# Patient Record
Sex: Female | Born: 1955 | Race: Black or African American | Hispanic: No | Marital: Single | Smoking: Never smoker
Health system: Southern US, Community
[De-identification: ages and names within clinical notes are randomized; demographics above are authoritative.]

## PROBLEM LIST (undated history)

## (undated) DIAGNOSIS — R748 Abnormal levels of other serum enzymes: Secondary | ICD-10-CM

## (undated) DIAGNOSIS — E876 Hypokalemia: Secondary | ICD-10-CM

## (undated) DIAGNOSIS — R9389 Abnormal findings on diagnostic imaging of other specified body structures: Secondary | ICD-10-CM

## (undated) DIAGNOSIS — I5030 Unspecified diastolic (congestive) heart failure: Secondary | ICD-10-CM

## (undated) DIAGNOSIS — I4892 Unspecified atrial flutter: Secondary | ICD-10-CM

## (undated) DIAGNOSIS — I480 Paroxysmal atrial fibrillation: Secondary | ICD-10-CM

## (undated) DIAGNOSIS — R3129 Other microscopic hematuria: Secondary | ICD-10-CM

## (undated) DIAGNOSIS — K219 Gastro-esophageal reflux disease without esophagitis: Secondary | ICD-10-CM

## (undated) HISTORY — PX: ABDOMINAL HYSTERECTOMY: SHX81

---

## 2013-09-03 ENCOUNTER — Inpatient Hospital Stay (HOSPITAL_BASED_OUTPATIENT_CLINIC_OR_DEPARTMENT_OTHER)
Admission: EM | Admit: 2013-09-03 | Discharge: 2013-09-04 | DRG: 287 | Disposition: A | Discharge status: Home / Self Care | Payer: BC Managed Care – PPO | Attending: Interventional Cardiology | Admitting: Interventional Cardiology

## 2013-09-03 ENCOUNTER — Emergency Department (HOSPITAL_BASED_OUTPATIENT_CLINIC_OR_DEPARTMENT_OTHER): Payer: BC Managed Care – PPO

## 2013-09-03 ENCOUNTER — Encounter (HOSPITAL_BASED_OUTPATIENT_CLINIC_OR_DEPARTMENT_OTHER): Payer: Self-pay | Admitting: Emergency Medicine

## 2013-09-03 ENCOUNTER — Inpatient Hospital Stay (HOSPITAL_COMMUNITY): Payer: BC Managed Care – PPO

## 2013-09-03 ENCOUNTER — Ambulatory Visit (HOSPITAL_COMMUNITY): Admit: 2013-09-03 | Payer: Self-pay | Admitting: Interventional Cardiology

## 2013-09-03 ENCOUNTER — Encounter (HOSPITAL_COMMUNITY)
Admission: EM | Disposition: A | Discharge status: Home / Self Care | Payer: BC Managed Care – PPO | Source: Home / Self Care | Attending: Interventional Cardiology

## 2013-09-03 DIAGNOSIS — R599 Enlarged lymph nodes, unspecified: Secondary | ICD-10-CM | POA: Diagnosis present

## 2013-09-03 DIAGNOSIS — R748 Abnormal levels of other serum enzymes: Secondary | ICD-10-CM | POA: Diagnosis present

## 2013-09-03 DIAGNOSIS — R9389 Abnormal findings on diagnostic imaging of other specified body structures: Secondary | ICD-10-CM | POA: Diagnosis present

## 2013-09-03 DIAGNOSIS — Z79899 Other long term (current) drug therapy: Secondary | ICD-10-CM

## 2013-09-03 DIAGNOSIS — I480 Paroxysmal atrial fibrillation: Secondary | ICD-10-CM | POA: Diagnosis present

## 2013-09-03 DIAGNOSIS — R0602 Shortness of breath: Secondary | ICD-10-CM | POA: Diagnosis present

## 2013-09-03 DIAGNOSIS — I5031 Acute diastolic (congestive) heart failure: Secondary | ICD-10-CM

## 2013-09-03 DIAGNOSIS — Z7982 Long term (current) use of aspirin: Secondary | ICD-10-CM

## 2013-09-03 DIAGNOSIS — I213 ST elevation (STEMI) myocardial infarction of unspecified site: Secondary | ICD-10-CM

## 2013-09-03 DIAGNOSIS — I4891 Unspecified atrial fibrillation: Secondary | ICD-10-CM

## 2013-09-03 DIAGNOSIS — R634 Abnormal weight loss: Secondary | ICD-10-CM

## 2013-09-03 DIAGNOSIS — R079 Chest pain, unspecified: Secondary | ICD-10-CM | POA: Diagnosis present

## 2013-09-03 DIAGNOSIS — R3129 Other microscopic hematuria: Secondary | ICD-10-CM | POA: Diagnosis present

## 2013-09-03 DIAGNOSIS — E876 Hypokalemia: Secondary | ICD-10-CM | POA: Diagnosis present

## 2013-09-03 DIAGNOSIS — I509 Heart failure, unspecified: Secondary | ICD-10-CM | POA: Diagnosis present

## 2013-09-03 DIAGNOSIS — R9431 Abnormal electrocardiogram [ECG] [EKG]: Secondary | ICD-10-CM | POA: Insufficient documentation

## 2013-09-03 DIAGNOSIS — I4892 Unspecified atrial flutter: Secondary | ICD-10-CM | POA: Diagnosis present

## 2013-09-03 HISTORY — DX: Gastro-esophageal reflux disease without esophagitis: K21.9

## 2013-09-03 HISTORY — PX: LEFT HEART CATHETERIZATION WITH CORONARY ANGIOGRAM: SHX5451

## 2013-09-03 HISTORY — DX: Abnormal findings on diagnostic imaging of other specified body structures: R93.89

## 2013-09-03 HISTORY — DX: Unspecified atrial flutter: I48.92

## 2013-09-03 HISTORY — DX: Other microscopic hematuria: R31.29

## 2013-09-03 HISTORY — DX: Paroxysmal atrial fibrillation: I48.0

## 2013-09-03 HISTORY — DX: Hypokalemia: E87.6

## 2013-09-03 HISTORY — DX: Unspecified diastolic (congestive) heart failure: I50.30

## 2013-09-03 HISTORY — DX: Abnormal levels of other serum enzymes: R74.8

## 2013-09-03 LAB — CBC WITH DIFFERENTIAL/PLATELET
Basophils Absolute: 0 10*3/uL (ref 0.0–0.1)
Basophils Relative: 0 % (ref 0–1)
Hemoglobin: 12.6 g/dL (ref 12.0–15.0)
MCH: 27.6 pg (ref 26.0–34.0)
MCHC: 33.6 g/dL (ref 30.0–36.0)
Neutro Abs: 3.8 10*3/uL (ref 1.7–7.7)
Neutrophils Relative %: 64 % (ref 43–77)
Platelets: 246 10*3/uL (ref 150–400)
RDW: 13.2 % (ref 11.5–15.5)

## 2013-09-03 LAB — COMPREHENSIVE METABOLIC PANEL
AST: 25 U/L (ref 0–37)
Albumin: 3.5 g/dL (ref 3.5–5.2)
Alkaline Phosphatase: 162 U/L — ABNORMAL HIGH (ref 39–117)
Calcium: 10.2 mg/dL (ref 8.4–10.5)
Chloride: 104 mEq/L (ref 96–112)
GFR calc Af Amer: >90 mL/min (ref >90)
Potassium: 3.6 mEq/L (ref 3.5–5.1)
Sodium: 141 mEq/L (ref 135–145)
Total Bilirubin: 0.8 mg/dL (ref 0.3–1.2)

## 2013-09-03 LAB — D-DIMER, QUANTITATIVE (NOT AT ARMC): D-Dimer, Quant: 0.47 ug/mL-FEU (ref 0.00–0.48)

## 2013-09-03 LAB — TROPONIN I: Troponin I: <0.3 ng/mL (ref <0.30)

## 2013-09-03 SURGERY — LEFT HEART CATHETERIZATION WITH CORONARY ANGIOGRAM
Anesthesia: LOCAL

## 2013-09-03 MED ORDER — NITROGLYCERIN IN D5W 200-5 MCG/ML-% IV SOLN
INTRAVENOUS | Status: AC
  Filled 2013-09-03: qty 250

## 2013-09-03 MED ORDER — NITROGLYCERIN 0.4 MG SL SUBL
0.4000 mg | SUBLINGUAL_TABLET | SUBLINGUAL | Status: DC | PRN
  Administered 2013-09-03: 0.4 mg via SUBLINGUAL
  Filled 2013-09-03 (×2): qty 25

## 2013-09-03 MED ORDER — ONDANSETRON HCL 4 MG/2ML IJ SOLN: 4.0000 mg | Freq: Four times a day (QID) | INTRAMUSCULAR | Status: DC | PRN

## 2013-09-03 MED ORDER — HEPARIN SODIUM (PORCINE) 5000 UNIT/ML IJ SOLN: 60.0000 [IU]/kg | INTRAMUSCULAR | Status: DC

## 2013-09-03 MED ORDER — ACETAMINOPHEN 325 MG PO TABS: 650.0000 mg | ORAL_TABLET | ORAL | Status: DC | PRN

## 2013-09-03 MED ORDER — METOPROLOL TARTRATE 25 MG PO TABS
25.0000 mg | ORAL_TABLET | Freq: Two times a day (BID) | ORAL | Status: DC
  Administered 2013-09-03 – 2013-09-04 (×2): 25 mg via ORAL
  Filled 2013-09-03 (×3): qty 1

## 2013-09-03 MED ORDER — HEPARIN SODIUM (PORCINE) 5000 UNIT/ML IJ SOLN
INTRAMUSCULAR | Status: AC
  Filled 2013-09-03: qty 1

## 2013-09-03 MED ORDER — SODIUM CHLORIDE 0.9 % IV SOLN: INTRAVENOUS | Status: DC

## 2013-09-03 MED ORDER — HEPARIN (PORCINE) IN NACL 100-0.45 UNIT/ML-% IJ SOLN
INTRAMUSCULAR | Status: AC
  Filled 2013-09-03: qty 250

## 2013-09-03 MED ORDER — IOHEXOL 350 MG/ML SOLN: 100.0000 mL | Freq: Once | INTRAVENOUS | Status: AC | PRN

## 2013-09-03 MED ORDER — METOPROLOL TARTRATE 25 MG PO TABS: 25.0000 mg | ORAL_TABLET | Freq: Two times a day (BID) | ORAL | Status: DC

## 2013-09-03 MED ORDER — HEPARIN BOLUS VIA INFUSION
4000.0000 [IU] | Freq: Once | INTRAVENOUS | Status: AC
  Administered 2013-09-03: 4000 [IU] via INTRAVENOUS

## 2013-09-03 MED ORDER — ASPIRIN 81 MG PO CHEW
324.0000 mg | CHEWABLE_TABLET | Freq: Once | ORAL | Status: AC
  Administered 2013-09-03: 162 mg via ORAL
  Filled 2013-09-03: qty 4

## 2013-09-03 MED ORDER — SODIUM CHLORIDE 0.9 % IV SOLN
1.0000 mL/kg/h | INTRAVENOUS | Status: AC
  Administered 2013-09-03: 1 mL/kg/h via INTRAVENOUS

## 2013-09-03 NOTE — Progress Notes (Signed)
*  Preliminary Results* Bilateral lower extremity venous duplex completed. Bilateral lower extremities are negative for deep vein thrombosis. There is no evidence of Baker's cyst bilaterally.  09/03/2013  Gertie Fey, RVT, RDCS, RDMS

## 2013-09-03 NOTE — H&P (Signed)
Admit date: 09/03/2013 Referring Physician Dr. Anitra Lauth Primary Cardiologist Eldridge Dace- new Chief complaint/reason for admission: chest pain, abnormal ECG  HPI: 57 y/o who has had some weight loss over the past few months.  She has had left leg swelling over the past few days.  It is worse when she stands for prolonged periods of time.  Swelling is always worse in teh left leg compared to the right leg.    Today, she felt some chest pain.  She has had some SHOB.  It is hard for her to get a deep breath.  She went to [facility].  Her ECG was quite abnormal with deep anterior T wave inversions.  Due to active CP, she was transferred to the [facility] Cath lab.      PMH:    Past Medical History  Diagnosis Date  . Chest pain, unspecified   . Shortness of breath   . PAF (paroxysmal atrial fibrillation)   . Nonspecific abnormal electrocardiogram (ECG) (EKG)     PSH:    Past Surgical History  Procedure Laterality Date  . Abdominal hysterectomy      ALLERGIES:   Review of patient's allergies indicates no known allergies.  Prior to Admit Meds:   No prescriptions prior to admission   Family HX:   No family history on file. Social HX:    History   Social History  . Marital Status: Single    Spouse Name: N/A    Number of Children: N/A  . Years of Education: N/A   Occupational History  . Not on file.   Social History Main Topics  . Smoking status: Never Smoker   . Smokeless tobacco: Not on file  . Alcohol Use: No  . Drug Use: No  . Sexual Activity: Not on file   Other Topics Concern  . Not on file   Social History Narrative  . No narrative on file     ROS:  All 11 ROS were addressed and are negative except what is stated in the HPI  PHYSICAL EXAM Filed Vitals:   09/03/13 1300  BP: 124/81  Pulse: 107  Temp: 98 F (36.7 C)  Resp: 16   General: Well developed, well nourished, in no acute distress Head: Eyes PERRLA, No xanthomas.   Normal cephalic and  atramatic  Lungs:  Clear bilaterally to auscultation and percussion. Heart:   HRRR S1 S2             No JVD.  No abdominal bruits. No femoral bruits. Abdomen: Bowel sounds are positive, abdomen soft and non-tender without masses or                  Hernia's noted. Msk:  Back normal, normal gait. Normal strength and tone for age. Extremities:   Pitting left leg edema. 2+ right radial pulse Neuro: Alert and oriented X 3. Psych:  Normal affect, responds appropriately   Labs:   Lab Results  Component Value Date   WBC 5.9 09/03/2013   HGB 12.6 09/03/2013   HCT 37.5 09/03/2013   MCV 82.1 09/03/2013   PLT 246 09/03/2013    Recent Labs Lab 09/03/13 1300  NA 141  K 3.6  CL 104  CO2 28  BUN 15  CREATININE 0.70  CALCIUM 10.2  PROT 7.6  BILITOT 0.8  ALKPHOS 162*  ALT 27  AST 25  GLUCOSE 105*   Lab Results  Component Value Date   TROPONINI <0.30 09/03/2013   No results found  for this basename: PTT   Lab Results  Component Value Date   INR 1.07 09/03/2013     No results found for this basename: CHOL   No results found for this basename: HDL   No results found for this basename: LDLCALC   No results found for this basename: TRIG   No results found for this basename: CHOLHDL   No results found for this basename: LDLDIRECT      Radiology:  @RISRSLT24 @  EKG:  Normal sinus rhythm, slight ST elevation with deep T wave inversion in the anterior leads  ASSESSMENT: Chest discomfort, shortness of breath, left leg swelling, abnormal ECG  PLAN:  She will undergo emergent cardiac catheterization. If no etiology for her chest discomfort is found, would have to look into possible PE as cause of her symptoms. She does have several worrisome features including left leg swelling along with chest discomfort or shortness of breath.  Further plans based on the cardiac cath results.  Corky Crafts., MD  09/03/2013  3:03 PM  Addendum: No significant coronary artery disease. The  patient has had intermittent atrial fibrillation and intermittent atrial flutter which has resolved spontaneously back to normal sinus rhythm. We'll plan for CT scan of the chest to evaluate for pulmonary embolism. Check d-dimer.

## 2013-09-03 NOTE — Progress Notes (Signed)
Utilization Review Completed.Alonna Bartling T1/21/2014   

## 2013-09-03 NOTE — ED Notes (Signed)
Pt reports onset of centralized burning chest pain since Saturday. Reports shortness of breath with exertion. Swelling in left ankle. Pain non radiating. No cardiac history.

## 2013-09-03 NOTE — ED Notes (Addendum)
[facility]ia [facility] for on call cardio doctor-- spoke with Maisie Fus

## 2013-09-03 NOTE — CV Procedure (Signed)
       PROCEDURE:  Left heart catheterization with selective coronary angiography, left ventriculogram.    INDICATIONS:  Suspected anterior STEMI  The risks, benefits, and details of the procedure were explained to the patient.  The patient verbalized understanding and wanted to proceed.  Informed written consent was obtained.  PROCEDURE TECHNIQUE:  After Xylocaine anesthesia a 45F slender sheath was placed in the right radial artery with a single anterior needle wall stick.   Right coronary angiography was done using a Judkins R4 guide catheter.  Left coronary angiography was done using a CLS 3.0 guide catheter.  Left ventriculography was done using a pigtail catheter.  A TR band was used for hemostasis.   CONTRAST:  Total of 80 cc.  COMPLICATIONS:  None.    HEMODYNAMICS:  Aortic pressure was 91/49; LV pressure was 93/2; LVEDP 14.  There was no gradient between the left ventricle and aorta.    ANGIOGRAPHIC DATA:   The left main coronary artery is angiographically normal.  The left anterior descending artery is a large vessel which wraps around the apex. There several small diagonal vessels which are widely patent. The LAD system appears angiographically normal.  The left circumflex artery is a large vessel. There is a large, branching first obtuse marginal. The circumflex system appears angiographically normal.  The right coronary artery is a medium size vessel which is patent. There is a small PDA and posterior lateral artery.  LEFT VENTRICULOGRAM:  Left ventricular angiogram was done in the 30 RAO projection and revealed normal left ventricular wall motion and systolic function with an estimated ejection fraction of 50-55 %.  LVEDP was 14 mmHg.  IMPRESSIONS:  1. Normal left main coronary artery. 2. Normal left anterior descending artery and its branches. 3. Normal left circumflex artery and its branches. 4. Normal right coronary artery. 5. Normal left ventricular systolic  function.  LVEDP 14 mmHg.  Ejection fraction 55 %. 6.  Atrial arrhythmias including atrial fibrillation and atrial flutter intermittently after the catheterization. I am concerned that this could also be from a PE.  RECOMMENDATION:  No significant coronary artery disease. Given her left leg swelling, chest discomfort and shortness of breath, we will check a CT angiogram to rule out pulmonary embolism. She has received 2 boluses of IV heparin.  We'll also check d-dimer.  May need to start Cardizem drip due to elevated heart rate.

## 2013-09-03 NOTE — ED Provider Notes (Addendum)
CSN: [id number]     Arrival date & time 09/03/13  1252 History   First MD Initiated Contact with Patient 09/03/13 1310     Chief Complaint  Patient presents with  . Chest Pain  . Shortness of Breath   (Consider location/radiation/quality/duration/timing/severity/associated sxs/prior Treatment) Patient is a 57 y.o. female presenting with chest pain and shortness of breath. The history is provided by the patient.  Chest Pain Pain location:  Substernal area Pain quality: burning, pressure and tightness   Pain radiates to:  Mid back Pain radiates to the back: yes   Pain severity:  Moderate Onset quality:  Gradual Duration:  20 minutes Timing:  Intermittent Progression:  Worsening Chronicity:  New Context comment:  Stared 3 days ago  Relieved by:  Rest and aspirin Worsened by:  Exertion (states feels very fatigued, SOB and CP with any exertion) Ineffective treatments:  None tried Associated symptoms: back pain, diaphoresis, nausea, shortness of breath and weakness   Associated symptoms: no abdominal pain, no fever, no near-syncope and not vomiting   Risk factors: no coronary artery disease, no diabetes mellitus, no high cholesterol, no hypertension and no smoking   Risk factors comment:  But has not seen a PCP in years Shortness of Breath Associated symptoms: chest pain and diaphoresis   Associated symptoms: no abdominal pain, no fever and no vomiting     History reviewed. No pertinent past medical history. Past Surgical History  Procedure Laterality Date  . Abdominal hysterectomy     No family history on file. History  Substance Use Topics  . Smoking status: Never Smoker   . Smokeless tobacco: Not on file  . Alcohol Use: No   OB History   Grav Para Term Preterm Abortions TAB SAB Ect Mult Living                 Review of Systems  Constitutional: Positive for diaphoresis. Negative for fever.  Respiratory: Positive for shortness of breath.   Cardiovascular: Positive for  chest pain and leg swelling. Negative for near-syncope.       Has noted swelling in her left ankle for months now but not painful.  Gastrointestinal: Positive for nausea. Negative for vomiting and abdominal pain.  Musculoskeletal: Positive for back pain.  Neurological: Positive for weakness.  All other systems reviewed and are negative.    Allergies  Review of patient's allergies indicates no known allergies.  Home Medications  No current outpatient prescriptions on file. BP 124/81  Pulse 107  Temp(Src) 98 F (36.7 C) (Oral)  Resp 16  SpO2 98% Physical Exam  Nursing note and vitals reviewed. Constitutional: She is oriented to person, place, and time. She appears well-developed and well-nourished. No distress.  HENT:  Head: Normocephalic and atraumatic.  Mouth/Throat: Oropharynx is clear and moist.  Eyes: Conjunctivae and EOM are normal. Pupils are equal, round, and reactive to light.  Neck: Normal range of motion. Neck supple.  Cardiovascular: Regular rhythm and intact distal pulses.  Tachycardia present.   No murmur heard. Pulmonary/Chest: Effort normal and breath sounds normal. No respiratory distress. She has no wheezes. She has no rales.  Abdominal: Soft. She exhibits no distension. There is no tenderness. There is no rebound and no guarding.  Musculoskeletal: Normal range of motion. She exhibits edema. She exhibits no tenderness.  1+ edema in the left ankle  Neurological: She is alert and oriented to person, place, and time.  Skin: Skin is warm and dry. No rash noted. No erythema.  Psychiatric: She has a normal mood and affect. Her behavior is normal.    ED Course  Procedures (including critical care time) Labs Review Labs Reviewed  CBC WITH DIFFERENTIAL  COMPREHENSIVE METABOLIC PANEL  PROTIME-INR  APTT  URINALYSIS, ROUTINE W REFLEX MICROSCOPIC  TROPONIN I   Imaging Review No results found.  EKG Interpretation    Date/Time:  Monday September 03 2013 13:03:28  EST Ventricular Rate:  103 PR Interval:  150 QRS Duration: 76 QT Interval:  402 QTC Calculation: 526 R Axis:   54 Text Interpretation:  Sinus tachycardia Minimal voltage criteria for LVH, may be normal variant T wave abnormality, consider anterior ischemia Prolonged QT No significant change since last tracing Confirmed by Anitra Lauth  MD, Tavonna Worthington ([id number]) on 09/03/2013 1:14:10 PM            MDM   1. STEMI (ST elevation myocardial infarction)     Pt came in with good story for ACS with EKG concerning with borderline ST elevation in anterior leads.  Second EKG with minimal change and pt currently with some chest tightness.  Discussed with Dr. Susa Simmonds with cardiology and they advised to call a code STEMI which was activated and [facility] transported [facility].  Mildly tachy here at 105 but deneis any medical problems of tobacco use.  Pt took 2 81mg  asa this am.    CRITICAL CARE Performed by: Gwyneth Sprout Total critical care time: 30 Critical care time was exclusive of separately billable procedures and treating other patients. Critical care was necessary to treat or prevent imminent or life-threatening deterioration. Critical care was time spent personally by me on the following activities: development of treatment plan with patient and/or surrogate as well as nursing, discussions with consultants, evaluation of patient's response to treatment, examination of patient, obtaining history from patient or surrogate, ordering and performing treatments and interventions, ordering and review of laboratory studies, ordering and review of radiographic studies, pulse oximetry and re-evaluation of patient's condition.    Gwyneth Sprout, MD 09/03/13 1329  Gwyneth Sprout, MD 09/03/13 1358

## 2013-09-04 ENCOUNTER — Other Ambulatory Visit: Payer: Self-pay | Admitting: Physician Assistant

## 2013-09-04 ENCOUNTER — Encounter (HOSPITAL_COMMUNITY): Payer: Self-pay | Admitting: Physician Assistant

## 2013-09-04 DIAGNOSIS — R748 Abnormal levels of other serum enzymes: Secondary | ICD-10-CM | POA: Diagnosis present

## 2013-09-04 DIAGNOSIS — R9389 Abnormal findings on diagnostic imaging of other specified body structures: Secondary | ICD-10-CM | POA: Diagnosis present

## 2013-09-04 DIAGNOSIS — I4892 Unspecified atrial flutter: Secondary | ICD-10-CM

## 2013-09-04 DIAGNOSIS — I509 Heart failure, unspecified: Secondary | ICD-10-CM

## 2013-09-04 DIAGNOSIS — R634 Abnormal weight loss: Secondary | ICD-10-CM

## 2013-09-04 DIAGNOSIS — E876 Hypokalemia: Secondary | ICD-10-CM | POA: Diagnosis present

## 2013-09-04 DIAGNOSIS — R3129 Other microscopic hematuria: Secondary | ICD-10-CM | POA: Diagnosis present

## 2013-09-04 DIAGNOSIS — I4891 Unspecified atrial fibrillation: Secondary | ICD-10-CM

## 2013-09-04 DIAGNOSIS — I5031 Acute diastolic (congestive) heart failure: Principal | ICD-10-CM

## 2013-09-04 DIAGNOSIS — I5030 Unspecified diastolic (congestive) heart failure: Secondary | ICD-10-CM

## 2013-09-04 LAB — TROPONIN I: Troponin I: 0.32 ng/mL (ref <0.30)

## 2013-09-04 LAB — URINALYSIS, ROUTINE W REFLEX MICROSCOPIC
Glucose, UA: NEGATIVE mg/dL
Specific Gravity, Urine: 1.01 (ref 1.005–1.030)
Urobilinogen, UA: 2 mg/dL — ABNORMAL HIGH (ref 0.0–1.0)

## 2013-09-04 LAB — BASIC METABOLIC PANEL
CO2: 26 mEq/L (ref 19–32)
Calcium: 9.5 mg/dL (ref 8.4–10.5)
Chloride: 105 mEq/L (ref 96–112)
Creatinine, Ser: 0.37 mg/dL — ABNORMAL LOW (ref 0.50–1.10)
Glucose, Bld: 90 mg/dL (ref 70–99)

## 2013-09-04 LAB — TSH: TSH: <0.008 u[IU]/mL — ABNORMAL LOW (ref 0.350–4.500)

## 2013-09-04 LAB — URINE MICROSCOPIC-ADD ON

## 2013-09-04 LAB — CK TOTAL AND CKMB (NOT AT ARMC)
CK, MB: 4 ng/mL (ref 0.3–4.0)
Total CK: 58 U/L (ref 7–177)

## 2013-09-04 LAB — T4, FREE: Free T4: 3.35 ng/dL — ABNORMAL HIGH (ref 0.80–1.80)

## 2013-09-04 MED ORDER — FUROSEMIDE 20 MG PO TABS
20.0000 mg | ORAL_TABLET | Freq: Every day | ORAL | Status: DC
  Filled 2013-09-04: qty 1

## 2013-09-04 MED ORDER — FUROSEMIDE 20 MG PO TABS: 20.0000 mg | ORAL_TABLET | Freq: Every day | ORAL | Status: DC

## 2013-09-04 MED ORDER — NAPROXEN SODIUM 220 MG PO TABS: 220.0000 mg | ORAL_TABLET | Freq: Every day | ORAL | Status: DC | PRN

## 2013-09-04 MED ORDER — METOPROLOL TARTRATE 25 MG PO TABS: 25.0000 mg | ORAL_TABLET | Freq: Two times a day (BID) | ORAL | Status: DC

## 2013-09-04 MED ORDER — POTASSIUM CHLORIDE CRYS ER 20 MEQ PO TBCR
20.0000 meq | EXTENDED_RELEASE_TABLET | Freq: Every day | ORAL | Status: DC
  Administered 2013-09-04: 20 meq via ORAL
  Filled 2013-09-04: qty 1

## 2013-09-04 MED ORDER — ASPIRIN 325 MG PO TBEC: 325.0000 mg | DELAYED_RELEASE_TABLET | Freq: Every day | ORAL | Status: DC

## 2013-09-04 MED ORDER — POTASSIUM CHLORIDE CRYS ER 20 MEQ PO TBCR
20.0000 meq | EXTENDED_RELEASE_TABLET | Freq: Once | ORAL | Status: AC
  Administered 2013-09-04: 20 meq via ORAL
  Filled 2013-09-04: qty 1

## 2013-09-04 MED ORDER — POTASSIUM CHLORIDE CRYS ER 20 MEQ PO TBCR: 20.0000 meq | EXTENDED_RELEASE_TABLET | Freq: Every day | ORAL | Status: DC

## 2013-09-04 MED FILL — Fentanyl Citrate Inj 0.05 MG/ML: INTRAMUSCULAR | Qty: 2 | Status: AC

## 2013-09-04 MED FILL — Midazolam HCl Inj 2 MG/2ML (Base Equivalent): INTRAMUSCULAR | Qty: 2 | Status: AC

## 2013-09-04 MED FILL — Lidocaine HCl Local Preservative Free (PF) Inj 1%: INTRAMUSCULAR | Qty: 30 | Status: AC

## 2013-09-04 MED FILL — Verapamil HCl IV Soln 2.5 MG/ML: INTRAVENOUS | Qty: 2 | Status: AC

## 2013-09-04 MED FILL — Nitroglycerin IV Soln 200 MCG/ML in D5W: INTRAVENOUS | Qty: 1 | Status: AC

## 2013-09-04 MED FILL — Heparin Sodium (Porcine) 2 Unit/ML in Sodium Chloride 0.9%: INTRAMUSCULAR | Qty: 1000 | Status: AC

## 2013-09-04 NOTE — Progress Notes (Addendum)
Patient with abnormal ECG and chest pain.  Cath showed no CAD.  CT was negative for PE and dissection.  No DVT by u/s.  It did show LVH.  Will start low dose diuretic and potassium.  F/u with me in 2 weeks with echocardiogram as well.  LVH may be the cause of abnormal ECG.  Patient walked without problems.  She really wants to go home.  May return to work on 09/10/13.  Full discharge to follow.

## 2013-09-04 NOTE — Discharge Summary (Signed)
Discharge Summary   Patient ID: Deborah Banks MRN: [id number], DOB/AGE: Jul 14, 1956 57 y.o. Admit date: 09/03/2013 D/C date:     09/04/2013  Primary Care Provider: No primary provider on file. - does not have one, but will be calling [facility] Primary Cardiologist: Eldridge Dace  Primary Discharge Diagnoses:  1. Acute diastolic CHF 2. Paroxysmal atrial fibrillation/flutter 3. Abnormal ECG with normal coronaries by cath, CT angio neg for PE/dissection 4. Abnormal CT scan with mild mediastinal lymphadenopathy - recommended to f/u PCP 5. Hypokalemia 6. Microscopic hematuria by UA 7. Elevated alkaline phosphatase level 8. Unintentional weight loss  Hospital Course:  Ms. Tarman is a 57 y/o F with limited PMH who presented initially to [facility] 09/03/2013 with CP, L>R leg swelling and SOB. She has had unintentional weight loss over the past few months. She reported left leg swelling over the past few days, worse when with standing for prolonged periods of time. It always seemed to be worse in the L compared to the R. On day of admission, she felt some CP and SOB. It was hard for her to get a deep breath. Because of sx, she went to [facility]. Her ECG was quite abnormal with deep anterior T wave inversions. Due to active CP, she was transferred to the [facility] Cath lab for emergent catheterization. This demonstrated normal coronary arteries, LV EF was 50-55%, LVEDP . She did have atrial arrhythmias including triansient atrial fibrillation and atrial flutter intermittently after the catheterization. CT angio was performed which ruled out PE and dissection. It did show fibrotic change in both upper lobes, areas of mild adenopathy in the anterior mediastinum, and prominent heart with LVH. Radiology suggested that a followup study in 4-6 weeks may be advised to further assess. LE duplex was negative for DVT. Initial troponin was negative and 2nd troponin returned mildly elevated at 0.32. It  was felt that perhaps symptoms were due to mild diastolic CHF. She was started on low dose Lasix and potassium. Dr. Eldridge Dace recommended a f/u echo, BMET, and visit in 2 weeks in our office which has been arranged. He has seen and examined the patient today and feels she is stable for discharge. On day of discharge we sent off for TSH and free T4 given atrial arrhythmias and weight loss. Dr. Eldridge Dace recommends continuing ASA 325mg  and metoprolol. Further consideration for anticoag will be at his discretion.  She was instructed to f/u PCP for several reasons (I personally discussed these findings w/ her): - to discuss followup of her abnormal CT scan as well as to monitor her weight loss. (see above re: radiology recommendation) - alk phos was elevated at 162 - UA had large Hgb in it (not anemic)  Discharge Vitals: Blood pressure 116/49, pulse 90, temperature 98.1 F (36.7 C), temperature source Oral, resp. rate 18, height 5\' 5"  (1.651 m), weight 130 lb 8.2 oz (59.2 kg), SpO2 99.00%.  Labs: Lab Results  Component Value Date   WBC 5.9 09/03/2013   HGB 12.6 09/03/2013   HCT 37.5 09/03/2013   MCV 82.1 09/03/2013   PLT 246 09/03/2013     Recent Labs Lab 09/03/13 1300 09/04/13 0520  NA 141 140  K 3.6 3.4*  CL 104 105  CO2 28 26  BUN 15 16  CREATININE 0.70 0.37*  CALCIUM 10.2 9.5  PROT 7.6  --   BILITOT 0.8  --   ALKPHOS 162*  --   ALT 27  --   AST 25  --  GLUCOSE 105* 90    Recent Labs  09/03/13 1300 09/04/13 0520  CKTOTAL  --  58  CKMB  --  4.0  TROPONINI <0.30 0.32*    Lab Results  Component Value Date   DDIMER 0.47 09/03/2013    Diagnostic Studies/Procedures   Ct Angio Chest Pe W/cm &/or Wo Cm 09/03/2013   CLINICAL DATA:  Chest pain  EXAM: CT ANGIOGRAPHY CHEST WITH CONTRAST  TECHNIQUE: Multidetector CT imaging of the chest was performed using the standard protocol during bolus administration of intravenous contrast. Multiplanar CT image reconstructions including MIPs  were obtained to evaluate the vascular anatomy.  CONTRAST:  80 mL Omnipaque 300 nonionic  COMPARISON:  September 03, 2013 chest radiograph  FINDINGS: There is no demonstrable pulmonary embolus. There is no thoracic aortic aneurysm or dissection.  There is fibrotic change in both upper lobes with honeycombing, more on the right than on the left. There is patchy atelectasis in the right mid lung. There is no well-defined edema or consolidation.  There is focal soft tissue prominence in the anterior mediastinum measuring 2.4 x 1.8 cm. There is a focal right hilar lymph node measuring 1.8 by 1.2 cm. There are several small lymph nodes adjacent to the aortic arch.  Heart appears mildly enlarged with left ventricular hypertrophy. There is no appreciable pericardial thickening.  Visualized upper abdominal structures appear within normal limits. There are no blastic or lytic bone lesions.  Review of the MIP images confirms the above findings.  IMPRESSION: No demonstrable pulmonary embolus.  Fibrotic change in both upper lobes with patchy atelectatic change.  Areas of mild adenopathy. Probable mild adenopathy in the anterior mediastinum. There is soft tissue fullness in this area. Given this appearance, a followup study in 4 to 6 weeks may be advised to further assess.  Heart is prominent with left ventricular hypertrophy.   Electronically Signed   By: Bretta Bang M.D.   On: 09/03/2013 15:51   Dg Chest Port 1 View 09/03/2013   CLINICAL DATA:  Chest pain.  Shortness of breath on exertion.  EXAM: PORTABLE CHEST - 1 VIEW  COMPARISON:  None.  FINDINGS: A lordotic positioning is demonstrated. Lungs are adequately inflated with increase coarse interstitial markings over the mid to upper lungs. No evidence of effusion. Cardiomediastinal silhouette is within normal. There is minimal spondylosis of the spine.  IMPRESSION: Mild increase coarse interstitial markings over the mid to upper lungs likely chronic, although cannot  exclude acute interstitial pneumonitis.   Electronically Signed   By: Elberta Fortis M.D.   On: 09/03/2013 13:46    Discharge Medications     Medication List         aspirin 325 MG EC tablet  Take 1 tablet (325 mg total) by mouth daily.     furosemide 20 MG tablet  Commonly known as:  LASIX  Take 1 tablet (20 mg total) by mouth daily.     metoprolol tartrate 25 MG tablet  Commonly known as:  LOPRESSOR  Take 1 tablet (25 mg total) by mouth 2 (two) times daily.     naproxen sodium 220 MG tablet  Commonly known as:  ANAPROX  Take 1-2 tablets (220-440 mg total) by mouth daily as needed (for pain).     potassium chloride SA 20 MEQ tablet  Commonly known as:  K-DUR,KLOR-CON  Take 1 tablet (20 mEq total) by mouth daily.        Disposition   The patient will be discharged in stable  condition to home. Discharge Orders   Future Appointments Provider Department Dept Phone   09/18/2013 2:00 PM [facility] [place] [facility] Office [phone]   09/18/2013 2:30 PM Everette Rank, MD [facility] [phone]   09/18/2013 3:00 PM [facility] Echo Echo 3 [facility] SITE 3 ECHO LAB [phone]   Future Orders Complete By Expires   Diet - low sodium heart healthy  As directed    Discharge instructions  As directed    Comments:     Naproxen (Aleve) can increase risk of stomach bleeding while taking aspirin. Only use sparingly as needed.  Please call your PCP today to obtain an appointment within 1 week. There are a few things you need to discuss with them: - your CT scan showed some enlarged lymph nodes in the chest that may be reactive. The radiologist suggests a repeat scan in 4-6 weeks to further evaluate. - one of your liver tests was mildly abnormal (alkaline phosphatase level) - your urine test showed that you had microscopic blood in your urine - to evaluate your weight loss   Increase activity slowly  As directed    Comments:     No  driving for 2 days. No lifting over 5 lbs for 1 week. No sexual activity for 1 week. You may return to work on 09/10/13. Keep procedure site clean & dry. If you notice increased pain, swelling, bleeding or pus, call/return!  You may shower, but no soaking baths/hot tubs/pools for 1 week.     Follow-up Information   Follow up with Corky Crafts., MD. Loney Laurence, appointment, and heart ultrasound - 09/18/13 - arrive at 2pm)    Specialty:  Cardiology   Contact information:   [address] [place] [address] [phone]       Follow up with [facility] at  [place]. (See discharge instructions)    Specialty:  Family Medicine   Contact information:   [address] [place] [address] [phone]        Duration of Discharge Encounter: Greater than 30 minutes including physician and PA time.  Signed, Ronie Spies PA-C 09/04/2013, 9:52 AM  I have examined the patient and reviewed assessment and plan and discussed with patient.  Agree with above as stated.    [place]/AT RRR S1S2 CTA bilaterally Trace left leg edema 2+ right radial pulse, no hematoma  Continue beta blocker and aspirin due to PAF post cath.  She wants a PCP in [facility].  40 minutes of total time spent on this discharge.  Deniz Hannan S.    Keyondra Lagrand S.

## 2013-09-07 ENCOUNTER — Telehealth: Payer: Self-pay

## 2013-09-07 ENCOUNTER — Encounter: Payer: Self-pay | Admitting: Physician Assistant

## 2013-09-07 NOTE — Progress Notes (Signed)
TSH and free T4 reviewed, showing hyperthyroid state (drawn for atrial arrhythmias and weight loss on day of discharge). Dr. Eldridge Dace also made aware. I called and spoke with triage at our office since Dr. Hoyle Barr nurse is out - they will assist with letting patient know about labs and getting her a next-available appointment with Dr. Talmage Coin with endocrinology. Dayna Dunn PA-C

## 2013-09-07 NOTE — Telephone Encounter (Signed)
Received call from Ronie Spies PA patient recently in hospital thyroid functions abnormal.Patient needs to be referred to [facility].Dr.Kerr's office called left message with appointment scheduler to call me back patient needs appointment soon with Dr.Kerr.

## 2013-09-17 ENCOUNTER — Encounter: Payer: Self-pay | Admitting: Interventional Cardiology

## 2013-09-18 ENCOUNTER — Ambulatory Visit (INDEPENDENT_AMBULATORY_CARE_PROVIDER_SITE_OTHER): Payer: BC Managed Care – PPO | Admitting: Interventional Cardiology

## 2013-09-18 ENCOUNTER — Encounter: Payer: Self-pay | Admitting: Interventional Cardiology

## 2013-09-18 ENCOUNTER — Ambulatory Visit (HOSPITAL_COMMUNITY): Payer: BC Managed Care – PPO | Attending: Cardiology | Admitting: Radiology

## 2013-09-18 ENCOUNTER — Other Ambulatory Visit (INDEPENDENT_AMBULATORY_CARE_PROVIDER_SITE_OTHER): Payer: BC Managed Care – PPO

## 2013-09-18 ENCOUNTER — Telehealth: Payer: Self-pay

## 2013-09-18 ENCOUNTER — Encounter: Payer: Self-pay | Admitting: *Deleted

## 2013-09-18 VITALS — BP 110/80 | HR 88 | Ht 65.0 in | Wt 130.0 lb

## 2013-09-18 DIAGNOSIS — I5031 Acute diastolic (congestive) heart failure: Secondary | ICD-10-CM

## 2013-09-18 DIAGNOSIS — E876 Hypokalemia: Secondary | ICD-10-CM

## 2013-09-18 DIAGNOSIS — I4891 Unspecified atrial fibrillation: Secondary | ICD-10-CM

## 2013-09-18 DIAGNOSIS — I5030 Unspecified diastolic (congestive) heart failure: Secondary | ICD-10-CM

## 2013-09-18 DIAGNOSIS — R0602 Shortness of breath: Secondary | ICD-10-CM | POA: Insufficient documentation

## 2013-09-18 DIAGNOSIS — I509 Heart failure, unspecified: Secondary | ICD-10-CM | POA: Insufficient documentation

## 2013-09-18 DIAGNOSIS — E0590 Thyrotoxicosis, unspecified without thyrotoxic crisis or storm: Secondary | ICD-10-CM

## 2013-09-18 LAB — BASIC METABOLIC PANEL
BUN: 18 mg/dL (ref 6–23)
Calcium: 9.3 mg/dL (ref 8.4–10.5)
Chloride: 106 mEq/L (ref 96–112)
Creatinine, Ser: 0.4 mg/dL (ref 0.4–1.2)
GFR: 203.67 mL/min (ref >60.00)
Glucose, Bld: 107 mg/dL — ABNORMAL HIGH (ref 70–99)
Potassium: 3.5 mEq/L (ref 3.5–5.1)
Sodium: 138 mEq/L (ref 135–145)

## 2013-09-18 NOTE — Patient Instructions (Addendum)
You will have your echocardiogram at 3 pm today.  You have been referred to Endocrinology for hypothyroidism. You will need to be seen asap. Your appt with Dr. Sharl Ma for the end of January is to far out and you need to be seen sooner.

## 2013-09-18 NOTE — Progress Notes (Signed)
Patient ID: Deborah Banks, female   DOB: 1956-07-03, 57 y.o.   MRN: [id number]    [address] [place], [place]  [address] Phone: [phone] Fax:  [phone]  Date:  09/18/2013   ID:  Deborah Banks, DOB 1956/01/22, MRN [id number]  PCP:  No primary provider on file.      History of Present Illness: Deborah Banks is a 57 y.o. female who had an abnormal ECG. He was suspicious for an acute MI. She was taken emergently to the Cath Lab. She had clean coronary arteries. She was found to be hyperthyroid. She is been unable to see an endocrinologist at this time. She does not report any chest pain. She does feel like she is shaky. She feels somewhat weak as well and has to sit down while at work. Her job is somewhat physical. She stands a lot at the time.   Wt Readings from Last 3 Encounters:  09/18/13 130 lb (58.968 kg)  09/04/13 130 lb 8.2 oz (59.2 kg)  09/04/13 130 lb 8.2 oz (59.2 kg)     Past Medical History  Diagnosis Date  . PAF (paroxysmal atrial fibrillation)     a. During 09/2013 adm for CP - normal cors, no PE.  Marland Kitchen Paroxysmal atrial flutter     a. During 09/2013 adm for CP - normal cors, no PE.  Marland Kitchen Abnormal CT scan, chest     a. mild mediastinal lymphadenopathy - instructed to f/u PCP.  . Microscopic hematuria     a. During 09/2013 adm.  . Elevated alkaline phosphatase level     a. During 09/2013 adm.  . Hypokalemia   . Diastolic CHF     a. During 09/2013 adm for CP - normal cors, no PE. CT angio with LVH.  Marland Kitchen GERD (gastroesophageal reflux disease)     Current Outpatient Prescriptions  Medication Sig Dispense Refill  . aspirin EC 325 MG EC tablet Take 1 tablet (325 mg total) by mouth daily.      . furosemide (LASIX) 20 MG tablet Take 1 tablet (20 mg total) by mouth daily.  30 tablet  2  . metoprolol tartrate (LOPRESSOR) 25 MG tablet Take 1 tablet (25 mg total) by mouth 2 (two) times daily.  60 tablet  2  . potassium chloride SA (K-DUR,KLOR-CON) 20 MEQ  tablet Take 1 tablet (20 mEq total) by mouth daily.  30 tablet  2   No current facility-administered medications for this visit.    Allergies:   No Known Allergies  Social History:  The patient  reports that she has never smoked. She has never used smokeless tobacco. She reports that she does not drink alcohol or use illicit drugs.   Family History:  The patient's family history includes CAD in her mother; Diabetes in her mother; Heart attack in her mother.   ROS:  Please see the history of present illness.  No nausea, vomiting.  No fevers, chills.  No focal weakness.  No dysuria.    All other systems reviewed and negative.   PHYSICAL EXAM: VS:  BP 110/80  Pulse 88  Ht 5\' 5"  (1.651 m)  Wt 130 lb (58.968 kg)  BMI 21.63 kg/m2 Well nourished, well developed, in no acute distress HEENT: normal Neck: no JVD, no carotid bruits Cardiac:  normal S1, S2; RRR;  Lungs:  clear to auscultation bilaterally, no wheezing, rhonchi or rales Abd: soft, nontender, no hepatomegaly Ext: no edema Skin: warm and dry Neuro:  no focal abnormalities noted    ASSESSMENT AND PLAN:  1. Hyperthyroid: Refer to endocrinology for management of hyperthyroidism.  Continue beta blocker. 2. Paroxysmal  Fibrillation: This is noted during her cath procedure. No significant palpitations. May be related to hyperthyroidism. Continue beta blocker. Continue aspirin.  3.  elevated LVEDP at the time of cath. Likely acute diastolic heart failure. Perhaps related to atrial fibrillation. Continue diuretics for now. Electrolytes were checked earlier today. Results pending.  Signed, Fredric Mare, MD, [facility] 09/18/2013 2:36 PM

## 2013-09-18 NOTE — Telephone Encounter (Signed)
Referral form to Dr.Kerr completed and faxed on 09/10/13 to fax # [phone].

## 2013-09-18 NOTE — Progress Notes (Signed)
Echocardiogram performed.  

## 2013-09-20 ENCOUNTER — Ambulatory Visit (INDEPENDENT_AMBULATORY_CARE_PROVIDER_SITE_OTHER): Payer: BC Managed Care – PPO | Admitting: Endocrinology

## 2013-09-20 ENCOUNTER — Telehealth: Payer: Self-pay | Admitting: Interventional Cardiology

## 2013-09-20 ENCOUNTER — Encounter: Payer: Self-pay | Admitting: Endocrinology

## 2013-09-20 VITALS — BP 138/62 | HR 83 | Temp 98.2°F | Resp 12 | Ht 63.0 in | Wt 133.8 lb

## 2013-09-20 DIAGNOSIS — E0590 Thyrotoxicosis, unspecified without thyrotoxic crisis or storm: Secondary | ICD-10-CM

## 2013-09-20 MED ORDER — METHIMAZOLE 10 MG PO TABS: 10.0000 mg | ORAL_TABLET | Freq: Two times a day (BID) | ORAL | Status: DC

## 2013-09-20 NOTE — Telephone Encounter (Signed)
LMOM For Pt FMLA Signed and Ready For Pick up

## 2013-09-20 NOTE — Telephone Encounter (Signed)
FMLA faxed to [facility] Attn: Lupita Leash Dantzler @ [phone]

## 2013-09-20 NOTE — Patient Instructions (Signed)
Methimazole 10mg  twice daily  Change metoprolol to Metoprolol ER 100mg  once daily

## 2013-09-20 NOTE — Progress Notes (Signed)
[facility]          [name] M.D.  Patient ID: Deborah Banks, female   DOB: [birth year], 57 y.o.   MRN: [id number]   Reason for Appointment:  Hyperthyroidism, new consultation  History of Present Illness:   The Hyperthyroidism was first diagnosed in 09/2013 She presented to the emergency room with history of chest pain; also was having symptoms of palpitations, shortness of breath for about a month prior to admission Because of her fast heart rate she was evaluated for hyperthyroidism and had significantly abnormal studies She does also give a history of some nervousness at times, fatigue which is more prominent recently She has lost 30 pounds since about June She denies any symptoms of excessive warmth or sweating and also no tremor of her hands  She also complains of swelling of her legs especially left side recently. The swelling is mostly in the evening after standing on it. She was evaluated with a Doppler study in the hospital which is negative and she was sent home on Lasix   Lab Results  Component Value Date   FREET4 3.35* 09/04/2013   TSH <0.008* 09/04/2013         Appointment on 09/18/2013  Component Date Value Range Status  . Sodium 09/18/2013 138  135 - 145 mEq/L Final  . Potassium 09/18/2013 3.5  3.5 - 5.1 mEq/L Final  . Chloride 09/18/2013 106  96 - 112 mEq/L Final  . CO2 09/18/2013 28  19 - 32 mEq/L Final  . Glucose, Bld 09/18/2013 107* 70 - 99 mg/dL Final  . BUN 40/98/1191 18  6 - 23 mg/dL Final  . Creatinine, Ser 09/18/2013 0.4  0.4 - 1.2 mg/dL Final  . Calcium 47/82/9562 9.3  8.4 - 10.5 mg/dL Final  . GFR 13/05/6577 203.67  >60.00 mL/min Final      Medication List       This list is accurate as of: 09/20/13 11:59 PM.  Always use your most recent med list.               aspirin 325 MG EC tablet  Take 1 tablet (325 mg total) by mouth daily.     furosemide 20 MG tablet  Commonly known as:  LASIX  Take 1 tablet  (20 mg total) by mouth daily.     methimazole 10 MG tablet  Commonly known as:  TAPAZOLE  Take 1 tablet (10 mg total) by mouth 2 (two) times daily.     metoprolol tartrate 25 MG tablet  Commonly known as:  LOPRESSOR  Take 1 tablet (25 mg total) by mouth 2 (two) times daily.     potassium chloride SA 20 MEQ tablet  Commonly known as:  K-DUR,KLOR-CON  Take 1 tablet (20 mEq total) by mouth daily.            Past Medical History  Diagnosis Date  . PAF (paroxysmal atrial fibrillation)     a. During 09/2013 adm for CP - normal cors, no PE.  Marland Kitchen Paroxysmal atrial flutter     a. During 09/2013 adm for CP - normal cors, no PE.  Marland Kitchen Abnormal CT scan, chest     a. mild mediastinal lymphadenopathy - instructed to f/u PCP.  . Microscopic hematuria     a. During 09/2013 adm.  . Elevated alkaline phosphatase level     a. During 09/2013 adm.  . Hypokalemia   .  Diastolic CHF     a. During 09/2013 adm for CP - normal cors, no PE. CT angio with LVH.  Marland Kitchen GERD (gastroesophageal reflux disease)     Past Surgical History  Procedure Laterality Date  . Abdominal hysterectomy      Family History  Problem Relation Age of Onset  . Heart attack Mother   . CAD Mother   . Diabetes Mother   . Hypertension Neg Hx   . Thyroid disease Other     Social History:  reports that she has never smoked. She has never used smokeless tobacco. She reports that she does not drink alcohol or use illicit drugs.  Allergies: No Known Allergies  Review of Systems:  She did not complain of excessive watering or irritation of her eyes and no prominence of eyesher CARDIOLOGY: no history of high blood pressure.       RESPIRATORY:  no dyspnea on exertion.      GASTROENTEROLOGY:  no Change in bowel habits.      ENDOCRINOLOGY:  no history of Diabetes.             Examination:   BP 138/62  Pulse 83  Temp(Src) 98.2 F (36.8 C)  Resp 12  Ht 5\' 3"  (1.6 m)  Wt 133 lb 12.8 oz (60.691 kg)  BMI 23.71 kg/m2  SpO2  99%   General Appearance:  well-built and nourished, pleasant, not hyperkinetic..        Eyes:  she has exophthalmos present. Exophthalmometer measurements are 26 mm on the left and 23 on the right She has a lower leg are traction bilaterally. No significant lid lag and has mild stare. No swelling of the eyelids  Neck: The thyroid is enlarged  2-2.5 times normal, smooth, mostly on the right side, non-tender and relatively soft. There is no lymphadenopathy .          Heart: normal S1 and S2, no murmurs .         Lungs: breath sounds are clear bilaterally  Extremities: hands are warm. She has 1+ left ankle edema. Has some thickening of the skin on the left lower shin anteriorly. Right lower leg and foot are normal Neurological: REFLEXES: at biceps are  very brisk.  TREMORS:  left-sided fine tremors are present..    Assessment/Plan:   Hyperthyroidism, Likely to be from Graves' disease     She has fairly typical signs and symptoms of hyperthyroidism and markedly increased free T4 level Discussed with the patient in detail the cause of hyperthyroidism with Graves' disease and natural history Discussed normal role of thyroid function Explained to the patient that there are 2 general options for treatment but in her case because of the significant goiter and degree of hyperthyroidism she is unlikely to get into remission with antithyroid drugs She is somewhat reluctant to consider I-131 treatment even though she was explained how this is done in detail  Since she recently had a contrast dye injection with her CT scan will defer her I-131 studies and treatment for another 4-6 weeks at least Given her information on hyperthyroidism and treatment options She will start taking METHIMAZOLE 10 mg twice a day Because of her continued tachycardia with 50 mg of metoprolol she was given a prescription for 100 mg extended release preparation She will followup in 4 weeks for reassessment and consideration of  I-131 treatment  Leg edema: Not clear if she has venous edema of the left leg or some pretibial myxedema from Graves'  disease. She will wear elastic stockings when she is standing for long and continue Lasix   total visit time = 25 minutes including counseling  Tyresse Jayson 09/21/2013, 7:39 PM

## 2013-09-25 NOTE — Telephone Encounter (Signed)
Rtc pts call.

## 2013-09-25 NOTE — Telephone Encounter (Signed)
Follow up ° ° ° ° °Returned Amy's call °

## 2013-10-15 ENCOUNTER — Encounter: Payer: Self-pay | Admitting: Endocrinology

## 2013-10-15 ENCOUNTER — Ambulatory Visit (INDEPENDENT_AMBULATORY_CARE_PROVIDER_SITE_OTHER): Payer: BC Managed Care – PPO | Admitting: Endocrinology

## 2013-10-15 VITALS — BP 136/76 | HR 80 | Temp 98.0°F | Resp 12 | Ht 63.25 in | Wt 136.3 lb

## 2013-10-15 DIAGNOSIS — E0590 Thyrotoxicosis, unspecified without thyrotoxic crisis or storm: Secondary | ICD-10-CM

## 2013-10-15 NOTE — Progress Notes (Signed)
[facility]          Deborah Banks M.D.  Patient ID: Deborah MartesBarbara Banks, female   DOB: [birth year], 58 y.o.   MRN: [id number]   Reason for Appointment:  Hyperthyroidism, new consultation  History of Present Illness:   The Hyperthyroidism was first diagnosed in 09/2013  occ palpit, recent more bms some heat  Initial presentation: Had chest pain; also was having symptoms of palpitations, shortness of breath for about a month prior to admission. Also had nervousness at times, fatigue and mild heat intolerance She had lost 30 pounds since about June She also has had swelling of her legs especially left side recently. Treated empirically with Lasix in the hospital  Recent history: Since she has had fairly significant hyperthyroidism with a large goiter she was recommended I-131 treatment However this needs to be delayed because of having a CT scan with contrast on 09/03/13 She has been taking methimazole 10 mg twice a day since 09/21/13 without side effects With this she is having less frequent palpitations and only some heat intolerance but has regained some weight. Had been continued on metoprolol 50 mg daily for tachycardia Is asking about more frequent bowel movements recently with some urgency   Wt Readings from Last 3 Encounters:  10/15/13 136 lb 4.8 oz (61.825 kg)  09/20/13 133 lb 12.8 oz (60.691 kg)  09/18/13 130 lb (58.968 kg)    Lab Results  Component Value Date   FREET4 3.35* 09/04/2013   TSH <0.008* 09/04/2013         No visits with results within 1 Week(s) from this visit. Latest known visit with results is:  Appointment on 09/18/2013  Component Date Value Range Status  . Sodium 09/18/2013 138  135 - 145 mEq/L Final  . Potassium 09/18/2013 3.5  3.5 - 5.1 mEq/L Final  . Chloride 09/18/2013 106  96 - 112 mEq/L Final  . CO2 09/18/2013 28  19 - 32 mEq/L Final  . Glucose, Bld 09/18/2013 107* 70 - 99 mg/dL Final  . BUN 29/56/213012/16/2014 18  6 - 23  mg/dL Final  . Creatinine, Ser 09/18/2013 0.4  0.4 - 1.2 mg/dL Final  . Calcium 86/57/846912/16/2014 9.3  8.4 - 10.5 mg/dL Final  . GFR 62/95/284112/16/2014 203.67  >60.00 mL/min Final      Medication List       This list is accurate as of: 10/15/13  4:35 PM.  Always use your most recent med list.               aspirin 325 MG EC tablet  Take 1 tablet (325 mg total) by mouth daily.     furosemide 20 MG tablet  Commonly known as:  LASIX  Take 1 tablet (20 mg total) by mouth daily.     methimazole 10 MG tablet  Commonly known as:  TAPAZOLE  Take 1 tablet (10 mg total) by mouth 2 (two) times daily.     metoprolol tartrate 25 MG tablet  Commonly known as:  LOPRESSOR  Take 1 tablet (25 mg total) by mouth 2 (two) times daily.     potassium chloride SA 20 MEQ tablet  Commonly known as:  K-DUR,KLOR-CON  Take 1 tablet (20 mEq total) by mouth daily.            Past Medical History  Diagnosis Date  . PAF (paroxysmal atrial fibrillation)     a. During 09/2013  adm for CP - normal cors, no PE.  Marland Kitchen Paroxysmal atrial flutter     a. During 09/2013 adm for CP - normal cors, no PE.  Marland Kitchen Abnormal CT scan, chest     a. mild mediastinal lymphadenopathy - instructed to f/u PCP.  . Microscopic hematuria     a. During 09/2013 adm.  . Elevated alkaline phosphatase level     a. During 09/2013 adm.  . Hypokalemia   . Diastolic CHF     a. During 09/2013 adm for CP - normal cors, no PE. CT angio with LVH.  Marland Kitchen GERD (gastroesophageal reflux disease)     Past Surgical History  Procedure Laterality Date  . Abdominal hysterectomy      Family History  Problem Relation Age of Onset  . Heart attack Mother   . CAD Mother   . Diabetes Mother   . Hypertension Neg Hx   . Thyroid disease Other     Social History:  reports that she has never smoked. She has never used smokeless tobacco. She reports that she does not drink alcohol or use illicit drugs.  Allergies: No Known Allergies  Review of Systems:    no  prior history of hypertension        Examination:   BP 136/76  Pulse 80  Temp(Src) 98 F (36.7 C)  Resp 12  Ht 5' 3.25" (1.607 m)  Wt 136 lb 4.8 oz (61.825 kg)  BMI 23.94 kg/m2  SpO2 96%   General Appearance:  is not unusually anxious        Eyes:  she has mild exophthalmos present, especially on the left. Neck: The thyroid is enlarged  2- times normal, smooth, >on the right side, relatively soft. Extremities: hands are warm. She has trace left ankle edema.  Neurological: REFLEXES: at biceps are  brisk.  TREMORS:  not present   Assessment/Plan:   Hyperthyroidism from Graves' disease   She has been on antithyroid drugs for about 3 weeks and is doing subjectively better. Also her physical signs and size of goiter seem to be less prominent Will check her thyroid levels today and decide on adjustment of her methimazole dose  Continue her antithyroid drugs still next month and plan on doing I-131 treatment at that time Given more detailed information on I-131 treatment today  [facility] 10/15/2013, 4:35 PM

## 2013-10-15 NOTE — Patient Instructions (Signed)
Rx to be decided on lab result

## 2013-10-16 LAB — COMPREHENSIVE METABOLIC PANEL
ALT: 16 U/L (ref 0–35)
AST: 18 U/L (ref 0–37)
Albumin: 3.4 g/dL — ABNORMAL LOW (ref 3.5–5.2)
Alkaline Phosphatase: 158 U/L — ABNORMAL HIGH (ref 39–117)
BUN: 14 mg/dL (ref 6–23)
CO2: 25 mEq/L (ref 19–32)
CREATININE: 0.6 mg/dL (ref 0.4–1.2)
Calcium: 8.9 mg/dL (ref 8.4–10.5)
Chloride: 109 mEq/L (ref 96–112)
GFR: 142.09 mL/min (ref >60.00)
GLUCOSE: 97 mg/dL (ref 70–99)
Potassium: 3.2 mEq/L — ABNORMAL LOW (ref 3.5–5.1)
Sodium: 140 mEq/L (ref 135–145)
Total Bilirubin: 0.5 mg/dL (ref 0.3–1.2)
Total Protein: 6.8 g/dL (ref 6.0–8.3)

## 2013-10-16 LAB — T3, FREE: T3 FREE: 4.9 pg/mL — AB (ref 2.3–4.2)

## 2013-10-16 LAB — T4, FREE: Free T4: 1.44 ng/dL (ref 0.60–1.60)

## 2013-10-18 DIAGNOSIS — Z0279 Encounter for issue of other medical certificate: Secondary | ICD-10-CM

## 2013-11-01 ENCOUNTER — Ambulatory Visit: Payer: BC Managed Care – PPO | Admitting: Family Medicine

## 2013-11-09 ENCOUNTER — Other Ambulatory Visit (INDEPENDENT_AMBULATORY_CARE_PROVIDER_SITE_OTHER): Payer: BC Managed Care – PPO

## 2013-11-09 DIAGNOSIS — E0590 Thyrotoxicosis, unspecified without thyrotoxic crisis or storm: Secondary | ICD-10-CM

## 2013-11-09 LAB — T3, FREE: T3 FREE: 3 pg/mL (ref 2.3–4.2)

## 2013-11-09 LAB — TSH: TSH: 0.05 u[IU]/mL — AB (ref 0.35–5.50)

## 2013-11-12 LAB — T4, FREE: FREE T4: 0.63 ng/dL (ref 0.60–1.60)

## 2013-11-14 ENCOUNTER — Ambulatory Visit (INDEPENDENT_AMBULATORY_CARE_PROVIDER_SITE_OTHER): Payer: BC Managed Care – PPO | Admitting: Endocrinology

## 2013-11-14 ENCOUNTER — Encounter: Payer: Self-pay | Admitting: Endocrinology

## 2013-11-14 VITALS — BP 112/70 | HR 62 | Temp 97.5°F | Resp 14 | Ht 63.25 in | Wt 131.4 lb

## 2013-11-14 DIAGNOSIS — E0590 Thyrotoxicosis, unspecified without thyrotoxic crisis or storm: Secondary | ICD-10-CM

## 2013-11-14 MED ORDER — METHIMAZOLE 10 MG PO TABS: 10.0000 mg | ORAL_TABLET | Freq: Every day | ORAL | Status: DC

## 2013-11-14 NOTE — Progress Notes (Signed)
[facility]                 [name] M.D.  Patient ID: Deborah Banks, female   DOB: [birth year], 58 y.o.   MRN: [id number]   Reason for Appointment:  Hyperthyroidism, followup  History of Present Illness:   The Hyperthyroidism was first diagnosed in 09/2013  Initial presentation: Had chest pain; also was having symptoms of palpitations, shortness of breath for about a month prior to admission. Also had nervousness at times, fatigue and mild heat intolerance She had lost 30 pounds since about June She also  had swelling of her legs especially left. Treated empirically with Lasix in the hospital  Recent history: She has been taking methimazole 10 mg twice a day since 09/21/13 without side effects Now she is not having any palpitations or heat intolerance although she had lost some weight. She has no complaints of change in appetite or bowels or shakiness Had been continued on metoprolol 50 mg daily for tachycardia   Wt Readings from Last 3 Encounters:  11/14/13 131 lb 6.4 oz (59.603 kg)  10/15/13 136 lb 4.8 oz (61.825 kg)  09/20/13 133 lb 12.8 oz (60.691 kg)    Lab Results  Component Value Date   FREET4 0.63 11/09/2013   FREET4 1.44 10/15/2013   FREET4 3.35* 09/04/2013   TSH 0.05* 11/09/2013   TSH <0.008* 09/04/2013         Appointment on 11/09/2013  Component Date Value Ref Range Status  . Free T4 11/09/2013 0.63  0.60 - 1.60 ng/dL Final  . TSH 40/98/1191 0.05* 0.35 - 5.50 uIU/mL Final  . T3, Free 11/09/2013 3.0  2.3 - 4.2 pg/mL Final      Medication List       This list is accurate as of: 11/14/13  3:41 PM.  Always use your most recent med list.               aspirin 325 MG EC tablet  Take 1 tablet (325 mg total) by mouth daily.     furosemide 20 MG tablet  Commonly known as:  LASIX  Take 1 tablet (20 mg total) by mouth daily.     methimazole 10 MG tablet  Commonly known as:  TAPAZOLE  Take 1 tablet (10 mg total) by mouth 2 (two)  times daily.     metoprolol tartrate 25 MG tablet  Commonly known as:  LOPRESSOR  Take 1 tablet (25 mg total) by mouth 2 (two) times daily.     potassium chloride SA 20 MEQ tablet  Commonly known as:  K-DUR,KLOR-CON  Take 1 tablet (20 mEq total) by mouth daily.            Past Medical History  Diagnosis Date  . PAF (paroxysmal atrial fibrillation)     a. During 09/2013 adm for CP - normal cors, no PE.  Marland Kitchen Paroxysmal atrial flutter     a. During 09/2013 adm for CP - normal cors, no PE.  Marland Kitchen Abnormal CT scan, chest     a. mild mediastinal lymphadenopathy - instructed to f/u PCP.  . Microscopic hematuria     a. During 09/2013 adm.  . Elevated alkaline phosphatase level     a. During 09/2013 adm.  . Hypokalemia   . Diastolic CHF     a. During 09/2013 adm for CP - normal cors, no PE. CT angio with LVH.  Marland Kitchen GERD (gastroesophageal  reflux disease)     Past Surgical History  Procedure Laterality Date  . Abdominal hysterectomy      Family History  Problem Relation Age of Onset  . Heart attack Mother   . CAD Mother   . Diabetes Mother   . Hypertension Neg Hx   . Thyroid disease Other     Social History:  reports that she has never smoked. She has never used smokeless tobacco. She reports that she does not drink alcohol or use illicit drugs.  Allergies: No Known Allergies  Review of Systems:    no prior history of hypertension        Examination:   BP 112/70  Pulse 62  Temp(Src) 97.5 F (36.4 C)  Resp 14  Ht 5' 3.25" (1.607 m)  Wt 131 lb 6.4 oz (59.603 kg)  BMI 23.08 kg/m2  SpO2 99%   General Appearance:  is not unusually anxious        Eyes:  she has mild exophthalmos present, especially on the left. Neck: The thyroid is enlarged  2 times normal, smooth, >on the right side, relatively soft. Extremities: hands are warm. Neurological: REFLEXES: at biceps are normal.  TREMORS:  not present   Assessment/Plan:   Hyperthyroidism from Graves' disease   She has  been on antithyroid drugs since 09/2013 with good control of her hyperthyroidism Also the size of goiter  is somewhat less Currently she is asymptomatic and her free T4 level is low normal She is somewhat reluctant to consider I-131 treatment at this time even though she has had fairly severe disease and significant goiter at onset She wants to continue antithyroid drugs but think about the I-131 treatment in the meantime  She will reduce her methimazole to 10 mg daily until her next visit  Deborah Banks 11/14/2013, 3:41 PM

## 2013-11-14 NOTE — Patient Instructions (Signed)
METOPROLOL; Take 1 daily till Sunday then stop  Methimazole 1 daily

## 2013-12-24 ENCOUNTER — Other Ambulatory Visit (INDEPENDENT_AMBULATORY_CARE_PROVIDER_SITE_OTHER): Payer: BC Managed Care – PPO

## 2013-12-24 DIAGNOSIS — E0590 Thyrotoxicosis, unspecified without thyrotoxic crisis or storm: Secondary | ICD-10-CM

## 2013-12-24 LAB — TSH: TSH: 0.05 u[IU]/mL — AB (ref 0.35–5.50)

## 2013-12-24 LAB — T4, FREE: FREE T4: 2.34 ng/dL — AB (ref 0.60–1.60)

## 2013-12-28 ENCOUNTER — Ambulatory Visit (INDEPENDENT_AMBULATORY_CARE_PROVIDER_SITE_OTHER): Payer: BC Managed Care – PPO | Admitting: Endocrinology

## 2013-12-28 ENCOUNTER — Encounter: Payer: Self-pay | Admitting: Endocrinology

## 2013-12-28 VITALS — BP 134/82 | HR 110 | Temp 98.1°F | Resp 14 | Ht 63.25 in | Wt 129.6 lb

## 2013-12-28 DIAGNOSIS — E0590 Thyrotoxicosis, unspecified without thyrotoxic crisis or storm: Secondary | ICD-10-CM

## 2013-12-28 MED ORDER — METHIMAZOLE 10 MG PO TABS: 20.0000 mg | ORAL_TABLET | Freq: Every day | ORAL | Status: DC

## 2013-12-28 NOTE — Patient Instructions (Signed)
Take thyoid pill 2x daily till 4/15 then take 1 1/2 tabs daily

## 2013-12-28 NOTE — Progress Notes (Signed)
[facility]                 Deborah LittlerAjay Khye Hochstetler M.D.  Patient ID: Deborah MartesBarbara Banks, female   DOB: 04/01/1956, 58 y.o.   MRN: [id number]   Reason for Appointment:  Hyperthyroidism, followup  History of Present Illness:   The Hyperthyroidism was first diagnosed in 09/2013  Initial presentation: Had chest pain; also was having symptoms of palpitations, shortness of breath for about a month prior to admission. Also had nervousness at times, fatigue and mild heat intolerance She had lost 30 pounds since about June She also  had swelling of her legs especially left. Treated empirically with Lasix in the hospital  Recent history: She was started on methimazole 10 mg twice a day on 09/21/13 without side effects On her last visit she had had control of her hyperthyroid symptoms and her free T4 was low normal at 0.60. With this her methimazole was reduced from 20 mg a day to 10 mg a day. However she has started having symptoms of palpitations and some fatigue again. Also has lost 2 pounds. She had run out of her medication 3-4 days before her lab values were drawn on 3/23 No other symptoms of hyperthyroidism  Had been continued on metoprolol 50 mg daily for tachycardia Previously had been desiring antithyroid drugs but is now more open to I-131 treatment   Wt Readings from Last 3 Encounters:  12/28/13 129 lb 9.6 oz (58.786 kg)  11/14/13 131 lb 6.4 oz (59.603 kg)  10/15/13 136 lb 4.8 oz (61.825 kg)    Lab Results  Component Value Date   FREET4 2.34* 12/24/2013   FREET4 0.63 11/09/2013   FREET4 1.44 10/15/2013   TSH 0.05* 12/24/2013   TSH 0.05* 11/09/2013   TSH <0.008* 09/04/2013         Appointment on 12/24/2013  Component Date Value Ref Range Status  . TSH 12/24/2013 0.05* 0.35 - 5.50 uIU/mL Final  . Free T4 12/24/2013 2.34* 0.60 - 1.60 ng/dL Final      Medication List       This list is accurate as of: 12/28/13  4:01 PM.  Always use your most recent med list.                aspirin 325 MG EC tablet  Take 1 tablet (325 mg total) by mouth daily.     furosemide 20 MG tablet  Commonly known as:  LASIX  Take 1 tablet (20 mg total) by mouth daily.     methimazole 10 MG tablet  Commonly known as:  TAPAZOLE  Take 1 tablet (10 mg total) by mouth daily.     metoprolol tartrate 25 MG tablet  Commonly known as:  LOPRESSOR  Take 1 tablet (25 mg total) by mouth 2 (two) times daily.     potassium chloride SA 20 MEQ tablet  Commonly known as:  K-DUR,KLOR-CON  Take 1 tablet (20 mEq total) by mouth daily.            Past Medical History  Diagnosis Date  . PAF (paroxysmal atrial fibrillation)     a. During 09/2013 adm for CP - normal cors, no PE.  Marland Kitchen. Paroxysmal atrial flutter     a. During 09/2013 adm for CP - normal cors, no PE.  Marland Kitchen. Abnormal CT scan, chest     a. mild mediastinal lymphadenopathy - instructed to f/u PCP.  . Microscopic hematuria  a. During 09/2013 adm.  . Elevated alkaline phosphatase level     a. During 09/2013 adm.  . Hypokalemia   . Diastolic CHF     a. During 09/2013 adm for CP - normal cors, no PE. CT angio with LVH.  Marland Kitchen GERD (gastroesophageal reflux disease)     Past Surgical History  Procedure Laterality Date  . Abdominal hysterectomy      Family History  Problem Relation Age of Onset  . Heart attack Mother   . CAD Mother   . Diabetes Mother   . Hypertension Neg Hx   . Thyroid disease Other     Social History:  reports that she has never smoked. She has never used smokeless tobacco. She reports that she does not drink alcohol or use illicit drugs.  Allergies: No Known Allergies  Review of Systems:    no prior history of hypertension        Examination:   BP 134/82  Pulse 110  Temp(Src) 98.1 F (36.7 C)  Resp 14  Ht 5' 3.25" (1.607 m)  Wt 129 lb 9.6 oz (58.786 kg)  BMI 22.76 kg/m2  SpO2 99%   General Appearance:  is not unusually anxious        Eyes:  she has mild exophthalmos present,  especially on the left. Neck: The thyroid is enlarged  2 times normal, smooth, >on the right side, relatively soft. Extremities: hands are warm. Neurological: REFLEXES: at biceps are slightly brisk.  TREMORS:  minimal on the right side   Assessment/Plan:   Hyperthyroidism from Graves' disease   She has been on antithyroid drugs since 09/2013 with initially good control of her hyperthyroidism However with reducing her dose to 10 mg the hyperthyroidism is significantly worse again and she appears symptomatic from this. Still has a relatively significant goiter She is finally agreeing to consider I-131 treatment at this time Will start her on methimazole  20 mg daily for 2 weeks and then 15 mg a day Discussed need to get her thyroid levels improved before scheduling her I-131 treatment.  Followup in one month with labs  [facility] 12/28/2013, 4:01 PM

## 2014-01-02 ENCOUNTER — Telehealth: Payer: Self-pay | Admitting: *Deleted

## 2014-01-02 NOTE — Telephone Encounter (Signed)
What medication

## 2014-01-25 ENCOUNTER — Encounter: Payer: Self-pay | Admitting: Endocrinology

## 2014-01-25 ENCOUNTER — Ambulatory Visit (INDEPENDENT_AMBULATORY_CARE_PROVIDER_SITE_OTHER): Payer: BC Managed Care – PPO | Admitting: Endocrinology

## 2014-01-25 ENCOUNTER — Other Ambulatory Visit: Payer: BC Managed Care – PPO

## 2014-01-25 VITALS — BP 120/68 | HR 85 | Temp 97.8°F | Resp 14 | Ht 63.5 in | Wt 129.8 lb

## 2014-01-25 DIAGNOSIS — E0590 Thyrotoxicosis, unspecified without thyrotoxic crisis or storm: Secondary | ICD-10-CM

## 2014-01-25 NOTE — Patient Instructions (Addendum)
You will be scheduled for the radioactive iodine uptake test. You will need to start methimazole for 7 days prior to this test  After the test is done you will be scheduled for the treatment dose of radioactive iodine. Start methimazole on the third day after getting this treatment dose. Continue taking metoprolol throughout this time period without stopping

## 2014-01-25 NOTE — Progress Notes (Signed)
[facility]                 Reather LittlerAjay Zaydenn Balaguer M.D.  Patient ID: Deborah MartesBarbara Coonan, female   DOB: [birth year], 58 y.o.   MRN: [id number]   Reason for Appointment:  Hyperthyroidism, followup  History of Present Illness:   The Hyperthyroidism was first diagnosed in 09/2013  Initial presentation: Had chest pain; also was having symptoms of palpitations, shortness of breath for about a month prior to admission. Also had nervousness at times, fatigue and mild heat intolerance She had lost 30 pounds since about June She also  had swelling of her legs especially left. Treated empirically with Lasix in the hospital  Recent history: She has been on methimazole 10 mg twice a day since 09/21/13 without side effects This was reduced to 10 mg in 2/15 when her T4 was low normal However subsequently her hyperthyroidism was worse clinically and with increased free T4 level She is back on 20 mg a day again She started feeling better only this week with less palpitations, fatigue and heat intolerance Had been continued on metoprolol 50 mg daily for tachycardia Previously had been desiring antithyroid drugs but is agreeable to I-131 treatment   Wt Readings from Last 3 Encounters:  01/25/14 129 lb 12.8 oz (58.877 kg)  12/28/13 129 lb 9.6 oz (58.786 kg)  11/14/13 131 lb 6.4 oz (59.603 kg)    Lab Results  Component Value Date   FREET4 2.34* 12/24/2013   FREET4 0.63 11/09/2013   FREET4 1.44 10/15/2013   TSH 0.05* 12/24/2013   TSH 0.05* 11/09/2013   TSH <0.008* 09/04/2013         No visits with results within 1 Week(s) from this visit. Latest known visit with results is:  Appointment on 12/24/2013  Component Date Value Ref Range Status  . TSH 12/24/2013 0.05* 0.35 - 5.50 uIU/mL Final  . Free T4 12/24/2013 2.34* 0.60 - 1.60 ng/dL Final      Medication List       This list is accurate as of: 01/25/14  2:01 PM.  Always use your most recent med list.               aspirin  325 MG EC tablet  Take 1 tablet (325 mg total) by mouth daily.     furosemide 20 MG tablet  Commonly known as:  LASIX  Take 1 tablet (20 mg total) by mouth daily.     methimazole 10 MG tablet  Commonly known as:  TAPAZOLE  Take 2 tablets (20 mg total) by mouth daily.     metoprolol tartrate 25 MG tablet  Commonly known as:  LOPRESSOR  Take 1 tablet (25 mg total) by mouth 2 (two) times daily.     potassium chloride SA 20 MEQ tablet  Commonly known as:  K-DUR,KLOR-CON  Take 1 tablet (20 mEq total) by mouth daily.            Past Medical History  Diagnosis Date  . PAF (paroxysmal atrial fibrillation)     a. During 09/2013 adm for CP - normal cors, no PE.  Marland Kitchen. Paroxysmal atrial flutter     a. During 09/2013 adm for CP - normal cors, no PE.  Marland Kitchen. Abnormal CT scan, chest     a. mild mediastinal lymphadenopathy - instructed to f/u PCP.  . Microscopic hematuria     a. During 09/2013 adm.  . Elevated alkaline phosphatase  level     a. During 09/2013 adm.  . Hypokalemia   . Diastolic CHF     a. During 09/2013 adm for CP - normal cors, no PE. CT angio with LVH.  Marland Kitchen. GERD (gastroesophageal reflux disease)     Past Surgical History  Procedure Laterality Date  . Abdominal hysterectomy      Family History  Problem Relation Age of Onset  . Heart attack Mother   . CAD Mother   . Diabetes Mother   . Hypertension Neg Hx   . Thyroid disease Other     Social History:  reports that she has never smoked. She has never used smokeless tobacco. She reports that she does not drink alcohol or use illicit drugs.  Allergies: No Known Allergies  Review of Systems:    no prior history of hypertension        Examination:   BP 120/68  Pulse 85  Temp(Src) 97.8 F (36.6 C)  Resp 14  Ht 5' 3.5" (1.613 m)  Wt 129 lb 12.8 oz (58.877 kg)  BMI 22.63 kg/m2  SpO2 97%   General Appearance:  she is not anxious       Eyes:  she has mild exophthalmos present, especially on the left. Neck: The  thyroid is enlarged  1.5-2 times normal, mostly on the right side, relatively soft. Extremities: hands are not unusually warm or diaphoretic Neurological: REFLEXES: at biceps are normal.  TREMORS: None   Assessment/Plan:   Hyperthyroidism from Graves' disease   She has been on antithyroid drugs since 09/2013 with initially good control of her hyperthyroidism Clinically she appears to be better again with 20 mg of methimazole daily If thyroid levels are significantly improved today will go ahead and schedule  her I-131 treatment. Discussed how this is done and need for I-131 uptake as well as holding the methimazole for a week before the test  Followup about 3 weeks after the I-131 treatment  Reather LittlerAjay Aceson Labell 01/25/2014, 2:01 PM

## 2014-01-28 LAB — T4, FREE: FREE T4: 0.91 ng/dL (ref 0.60–1.60)

## 2014-01-29 ENCOUNTER — Other Ambulatory Visit: Payer: Self-pay | Admitting: Endocrinology

## 2014-01-29 DIAGNOSIS — E0590 Thyrotoxicosis, unspecified without thyrotoxic crisis or storm: Secondary | ICD-10-CM

## 2014-01-30 ENCOUNTER — Telehealth: Payer: Self-pay | Admitting: Endocrinology

## 2014-01-30 NOTE — Telephone Encounter (Signed)
Pt calling for results

## 2014-01-30 NOTE — Telephone Encounter (Signed)
Results given.

## 2014-02-20 ENCOUNTER — Ambulatory Visit (HOSPITAL_COMMUNITY): Payer: BC Managed Care – PPO

## 2014-02-21 ENCOUNTER — Encounter (HOSPITAL_COMMUNITY): Payer: BC Managed Care – PPO

## 2014-03-04 ENCOUNTER — Ambulatory Visit: Payer: BC Managed Care – PPO | Admitting: Endocrinology

## 2014-03-07 ENCOUNTER — Encounter (HOSPITAL_COMMUNITY)
Admission: RE | Admit: 2014-03-07 | Discharge: 2014-03-07 | Disposition: A | Discharge status: Home / Self Care | Payer: BC Managed Care – PPO | Source: Ambulatory Visit | Attending: Endocrinology | Admitting: Endocrinology

## 2014-03-07 DIAGNOSIS — E0590 Thyrotoxicosis, unspecified without thyrotoxic crisis or storm: Secondary | ICD-10-CM

## 2014-03-07 MED ORDER — SODIUM IODIDE I 131 CAPSULE
11.0000 | Freq: Once | INTRAVENOUS | Status: AC | PRN
  Administered 2014-03-07: 11 via ORAL

## 2014-03-08 ENCOUNTER — Other Ambulatory Visit: Payer: Self-pay | Admitting: Endocrinology

## 2014-03-08 ENCOUNTER — Encounter (HOSPITAL_COMMUNITY)
Admission: RE | Admit: 2014-03-08 | Discharge: 2014-03-08 | Disposition: A | Discharge status: Home / Self Care | Payer: BC Managed Care – PPO | Source: Ambulatory Visit | Attending: Endocrinology | Admitting: Endocrinology

## 2014-03-08 DIAGNOSIS — E0590 Thyrotoxicosis, unspecified without thyrotoxic crisis or storm: Secondary | ICD-10-CM

## 2014-03-29 ENCOUNTER — Encounter: Payer: Self-pay | Admitting: Endocrinology

## 2014-03-29 ENCOUNTER — Ambulatory Visit (INDEPENDENT_AMBULATORY_CARE_PROVIDER_SITE_OTHER): Payer: BC Managed Care – PPO | Admitting: Endocrinology

## 2014-03-29 VITALS — BP 116/64 | HR 77 | Temp 98.5°F | Ht 63.5 in | Wt 129.0 lb

## 2014-03-29 DIAGNOSIS — E0590 Thyrotoxicosis, unspecified without thyrotoxic crisis or storm: Secondary | ICD-10-CM

## 2014-03-29 NOTE — Patient Instructions (Signed)
Stop Methimazole 1 week before I 131 treatment

## 2014-03-29 NOTE — Progress Notes (Signed)
Patient ID: Deborah MartesBarbara Scherzinger, female   DOB: 1956-03-06, 58 y.o.   MRN: [id number]   Reason for Appointment:  Hyperthyroidism, followup  History of Present Illness:   The Hyperthyroidism was first diagnosed in 09/2013  Initial presentation: Had chest pain; also was having symptoms of palpitations, shortness of breath for about a month prior to admission. Also had nervousness at times, fatigue and mild heat intolerance She had lost 30 pounds since about June She also  had swelling of her legs especially left. Treated empirically with Lasix in the hospital  Recent history: She has been on methimazole 10 mg twice a day since 09/21/13 without side effects Although her free T4 was in the normal range in April because of her need for persistent antithyroid drugs he was recommended I-131 treatment Her uptake was significantly high and she was referred for I-131 treatment about 3 weeks ago but still has not been scheduled Still taking methimazole as directed complaining about feeling tired and had no palpitations Her weight is unchanged Had been continued on metoprolol 50 mg daily for tachycardia   Wt Readings from Last 3 Encounters:  03/29/14 129 lb (58.514 kg)  01/25/14 129 lb 12.8 oz (58.877 kg)  12/28/13 129 lb 9.6 oz (58.786 kg)    Lab Results  Component Value Date   FREET4 0.91 01/25/2014   FREET4 2.34* 12/24/2013   FREET4 0.63 11/09/2013   TSH 0.05* 12/24/2013   TSH 0.05* 11/09/2013   TSH <0.008* 09/04/2013             Medication List       This list is accurate as of: 03/29/14 11:59 PM.  Always use your most recent med list.               aspirin 325 MG EC tablet  Take 1 tablet (325 mg total) by mouth daily.     furosemide 20 MG tablet  Commonly known as:  LASIX  Take 1 tablet (20 mg total) by mouth daily.     methimazole 10 MG tablet  Commonly known as:  TAPAZOLE  Take 2 tablets (20 mg total) by mouth daily.     metoprolol tartrate 25 MG  tablet  Commonly known as:  LOPRESSOR  Take 1 tablet (25 mg total) by mouth 2 (two) times daily.     potassium chloride SA 20 MEQ tablet  Commonly known as:  K-DUR,KLOR-CON  Take 1 tablet (20 mEq total) by mouth daily.            Past Medical History  Diagnosis Date  . PAF (paroxysmal atrial fibrillation)     a. During 09/2013 adm for CP - normal cors, no PE.  Marland Kitchen. Paroxysmal atrial flutter     a. During 09/2013 adm for CP - normal cors, no PE.  Marland Kitchen. Abnormal CT scan, chest     a. mild mediastinal lymphadenopathy - instructed to f/u PCP.  . Microscopic hematuria     a. During 09/2013 adm.  . Elevated alkaline phosphatase level     a. During 09/2013 adm.  . Hypokalemia   . Diastolic CHF     a. During 09/2013 adm for CP - normal cors, no PE. CT angio with LVH.  Marland Kitchen. GERD (gastroesophageal reflux disease)     Past Surgical History  Procedure Laterality Date  . Abdominal hysterectomy      Family History  Problem Relation Age of Onset  .  Heart attack Mother   . CAD Mother   . Diabetes Mother   . Hypertension Neg Hx   . Thyroid disease Other     Social History:  reports that she has never smoked. She has never used smokeless tobacco. She reports that she does not drink alcohol or use illicit drugs.  Allergies: No Known Allergies  Review of Systems:   She is complaining about posterior neck pain  No  history of hypertension        Examination:   BP 116/64  Pulse 77  Temp(Src) 98.5 F (36.9 C) (Oral)  Ht 5' 3.5" (1.613 m)  Wt 129 lb (58.514 kg)  BMI 22.49 kg/m2  SpO2 99%   General Appearance: pleasant and cooperative      Eyes:  she has mild exophthalmos present, especially on the left. bilateral mild eyelid edema present Neck: The thyroid is enlarged  1.5-2 times normal, mostly on the right side, relatively soft. Extremities: hands are not unusually warm or diaphoretic Neurological: REFLEXES: at biceps are normal.  TREMORS: None   Assessment/Plan:    Hyperthyroidism from Graves' disease   She has been on antithyroid drugs since 09/2013  Clinically she appears to be  euthyroid  with 20 mg of methimazole daily although she does complain of fatigue and occasional palpitations Objectively she has no tachycardia with taking metoprolol  Have been unable to contact her to schedule her I-131 treatment and this will be done again Discussed pros and cons of I-131 treatment and resulting hypothyroidism  Discussed  need for  holding the methimazole for a week before the test  Followup about 3 weeks after the I-131 treatment  KUMAR,AJAY 03/31/2014, 2:56 PM

## 2014-04-01 ENCOUNTER — Telehealth: Payer: Self-pay | Admitting: *Deleted

## 2014-04-01 LAB — T4, FREE: Free T4: 1.51 ng/dL (ref 0.60–1.60)

## 2014-04-01 LAB — TSH: TSH: 0.01 u[IU]/mL — ABNORMAL LOW (ref 0.35–4.50)

## 2014-04-01 NOTE — Telephone Encounter (Signed)
[facility] for April 11, 2014 at 12:30. Be advised.

## 2014-04-11 ENCOUNTER — Encounter (HOSPITAL_COMMUNITY)
Admission: RE | Admit: 2014-04-11 | Discharge: 2014-04-11 | Disposition: A | Discharge status: Home / Self Care | Payer: BC Managed Care – PPO | Source: Ambulatory Visit | Attending: Endocrinology | Admitting: Endocrinology

## 2014-04-11 DIAGNOSIS — E0590 Thyrotoxicosis, unspecified without thyrotoxic crisis or storm: Secondary | ICD-10-CM | POA: Insufficient documentation

## 2014-04-11 MED ORDER — SODIUM IODIDE I 131 CAPSULE
13.2000 | Freq: Once | INTRAVENOUS | Status: AC | PRN
  Administered 2014-04-11: 13.2 via ORAL

## 2014-05-01 ENCOUNTER — Ambulatory Visit (INDEPENDENT_AMBULATORY_CARE_PROVIDER_SITE_OTHER): Payer: BC Managed Care – PPO | Admitting: Endocrinology

## 2014-05-01 ENCOUNTER — Other Ambulatory Visit: Payer: Self-pay | Admitting: *Deleted

## 2014-05-01 ENCOUNTER — Encounter: Payer: Self-pay | Admitting: Endocrinology

## 2014-05-01 ENCOUNTER — Telehealth: Payer: Self-pay | Admitting: *Deleted

## 2014-05-01 VITALS — BP 110/62 | HR 66 | Temp 97.9°F | Resp 12 | Ht 63.5 in | Wt 128.0 lb

## 2014-05-01 DIAGNOSIS — R600 Localized edema: Secondary | ICD-10-CM

## 2014-05-01 DIAGNOSIS — E0590 Thyrotoxicosis, unspecified without thyrotoxic crisis or storm: Secondary | ICD-10-CM

## 2014-05-01 DIAGNOSIS — R609 Edema, unspecified: Secondary | ICD-10-CM

## 2014-05-01 DIAGNOSIS — E876 Hypokalemia: Secondary | ICD-10-CM

## 2014-05-01 LAB — BASIC METABOLIC PANEL
BUN: 9 mg/dL (ref 6–23)
CALCIUM: 9 mg/dL (ref 8.4–10.5)
CO2: 28 meq/L (ref 19–32)
Chloride: 106 mEq/L (ref 96–112)
Creatinine, Ser: 0.5 mg/dL (ref 0.4–1.2)
GFR: 182.55 mL/min (ref >60.00)
GLUCOSE: 78 mg/dL (ref 70–99)
Potassium: 2.8 mEq/L — CL (ref 3.5–5.1)
Sodium: 140 mEq/L (ref 135–145)

## 2014-05-01 LAB — T4, FREE: Free T4: 1.05 ng/dL (ref 0.60–1.60)

## 2014-05-01 MED ORDER — SPIRONOLACTONE 50 MG PO TABS
50.0000 mg | ORAL_TABLET | Freq: Every day | ORAL | Status: DC
Start: 2014-05-01 — End: 2014-09-04

## 2014-05-01 MED ORDER — POTASSIUM CHLORIDE CRYS ER 20 MEQ PO TBCR: EXTENDED_RELEASE_TABLET | ORAL | Status: DC

## 2014-05-01 NOTE — Progress Notes (Signed)
Patient ID: Deborah Banks, female   DOB: May 24, 1956, 58 y.o.   MRN: [id number]   Reason for Appointment:  Hyperthyroidism, followup  History of Present Illness:   The Hyperthyroidism was first diagnosed in 09/2013  Initial presentation: Had chest pain; also was having symptoms of palpitations, shortness of breath for about a month prior to admission. Also had nervousness at times, fatigue and mild heat intolerance She had lost 30 pounds since about June She also  had swelling of her legs especially left. Treated empirically with Lasix in the hospital  Recent history: She has been on methimazole 10 mg twice a day since 09/21/13 without side effects Although her free T4 was in the normal range in April because of her need for persistent antithyroid drugs he was recommended I-131 treatment With methimazole in 6/15 her free T4 level was high normal Her uptake was significantly high and she was referred for I-131 treatment  She finally had the I-131 treatment with 13.2 mCi on 04/11/14 Still taking methimazole as directed, 10 mg twice a day  Her weight is unchanged and she thinks she feel subjectively less tired now. No palpitations  Had been continued on metoprolol 50 mg daily for tachycardia but this has been stopped subsequently   Wt Readings from Last 3 Encounters:  05/01/14 128 lb (58.06 kg)  03/29/14 129 lb (58.514 kg)  01/25/14 129 lb 12.8 oz (58.877 kg)    Lab Results  Component Value Date   FREET4 1.51 03/29/2014   FREET4 0.91 01/25/2014   FREET4 2.34* 12/24/2013   TSH 0.01* 03/29/2014   TSH 0.05* 12/24/2013   TSH 0.05* 11/09/2013             Medication List       This list is accurate as of: 05/01/14  1:11 PM.  Always use your most recent med list.               aspirin 325 MG EC tablet  Take 1 tablet (325 mg total) by mouth daily.     furosemide 20 MG tablet  Commonly known as:  LASIX  Take 1 tablet (20 mg total) by mouth daily.     methimazole 10 MG tablet  Commonly known as:  TAPAZOLE  Take 2 tablets (20 mg total) by mouth daily.     metoprolol tartrate 25 MG tablet  Commonly known as:  LOPRESSOR  Take 1 tablet (25 mg total) by mouth 2 (two) times daily.     potassium chloride SA 20 MEQ tablet  Commonly known as:  K-DUR,KLOR-CON  Take 1 tablet (20 mEq total) by mouth daily.            Past Medical History  Diagnosis Date  . PAF (paroxysmal atrial fibrillation)     a. During 09/2013 adm for CP - normal cors, no PE.  Marland Kitchen Paroxysmal atrial flutter     a. During 09/2013 adm for CP - normal cors, no PE.  Marland Kitchen Abnormal CT scan, chest     a. mild mediastinal lymphadenopathy - instructed to f/u PCP.  . Microscopic hematuria     a. During 09/2013 adm.  . Elevated alkaline phosphatase level     a. During 09/2013 adm.  . Hypokalemia   . Diastolic CHF     a. During 09/2013 adm for CP - normal cors, no PE. CT angio with LVH.  Marland Kitchen GERD (gastroesophageal reflux disease)  Past Surgical History  Procedure Laterality Date  . Abdominal hysterectomy      Family History  Problem Relation Age of Onset  . Heart attack Mother   . CAD Mother   . Diabetes Mother   . Hypertension Neg Hx   . Thyroid disease Other     Social History:  reports that she has never smoked. She has never used smokeless tobacco. She reports that she does not drink alcohol or use illicit drugs.  Allergies: No Known Allergies  Review of Systems:   She is complaining about increasing left lower leg swelling and some discomfort. Previously had been treated with Lasix and does not take this now. No relief with his elastic stockings  No  history of hypertension        Examination:   BP 110/62  Pulse 66  Temp(Src) 97.9 F (36.6 C) (Oral)  Resp 12  Ht 5' 3.5" (1.613 m)  Wt 128 lb (58.06 kg)  BMI 22.32 kg/m2  SpO2 98%   General Appearance: pleasant and  looks well   Eyes:  she has mild exophthalmos present. bilateral mild eyelid edema  present Neck: The thyroid is enlarged  1.5 times normal, mostly on the right side, relatively soft. Extremities: hands are not unusually warm or diaphoretic Left leg shows significant swelling although there is only minimal pitting except around the ankle. Has hyperpigmented slightly thickened areas on the shin  Neurological: REFLEXES: at biceps are normal.      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 Will empirically start her on Lasix and she will followup with PCP  Followup  in 5 weeks again with thyroid levels  Deborah Banks 05/01/2014, 1:11 PM   Addendum: Labs as follows  Free T4 is normal, will stop methimazole Potassium is low, and clear why since she is reportedly not on Lasix Will have her take potassium and Aldactone instead of Lasix, needs followup potassium level in one week  Office Visit on 05/01/2014  Component Date Value Ref Range Status  . Free T4 05/01/2014 1.05  0.60 - 1.60 ng/dL Final  . Sodium 28/41/324407/29/2015 140  135 - 145 mEq/L Final  . Potassium 05/01/2014 2.8* 3.5 - 5.1 mEq/L Final  . Chloride 05/01/2014 106  96 - 112 mEq/L Final  . CO2 05/01/2014 28  19 - 32 mEq/L Final  . Glucose, Bld 05/01/2014 78  70 - 99 mg/dL Final  . BUN 01/02/725307/29/2015 9  6 - 23 mg/dL Final  . Creatinine, Ser 05/01/2014 0.5  0.4 - 1.2 mg/dL Final  . Calcium  66/44/034707/29/2015 9.0  8.4 - 10.5 mg/dL Final  . GFR 42/59/563807/29/2015 182.55  >60.00 mL/min Final

## 2014-05-01 NOTE — Telephone Encounter (Signed)
Hope from the [facility] called, patient has a critical potassium of 2.8

## 2014-05-01 NOTE — Telephone Encounter (Signed)
Do not take Furosemide. Start Aldactone 50mg  daily for swelling; also needs potassium 20meq 2x daily for 2 days then 1 daily for 15 days

## 2014-05-02 ENCOUNTER — Other Ambulatory Visit: Payer: Self-pay | Admitting: *Deleted

## 2014-05-02 DIAGNOSIS — E876 Hypokalemia: Secondary | ICD-10-CM

## 2014-05-02 NOTE — Telephone Encounter (Signed)
Patient is aware 

## 2014-05-03 ENCOUNTER — Telehealth: Payer: Self-pay | Admitting: Endocrinology

## 2014-05-03 NOTE — Telephone Encounter (Signed)
Patient ask if she can get her blood drawn at the [facility] on [address], Please advise

## 2014-05-13 ENCOUNTER — Other Ambulatory Visit (INDEPENDENT_AMBULATORY_CARE_PROVIDER_SITE_OTHER): Payer: BC Managed Care – PPO

## 2014-05-13 DIAGNOSIS — E0590 Thyrotoxicosis, unspecified without thyrotoxic crisis or storm: Secondary | ICD-10-CM

## 2014-05-13 DIAGNOSIS — E876 Hypokalemia: Secondary | ICD-10-CM

## 2014-05-13 LAB — POTASSIUM: Potassium: 3.7 mEq/L (ref 3.5–5.1)

## 2014-05-14 ENCOUNTER — Telehealth: Payer: Self-pay | Admitting: Interventional Cardiology

## 2014-05-14 LAB — T4, FREE: Free T4: 1.25 ng/dL (ref 0.60–1.60)

## 2014-05-14 LAB — TSH: TSH: 0.03 u[IU]/mL — ABNORMAL LOW (ref 0.35–4.50)

## 2014-05-14 NOTE — Progress Notes (Signed)
Quick Note:  Please let patient know that the potassium result is normal and continue prescription as directed ______

## 2014-05-14 NOTE — Telephone Encounter (Signed)
FMLA signed 12.15.2014 never Picked Up by pt, This was discarded Of  8.11.15/km

## 2014-06-12 ENCOUNTER — Ambulatory Visit: Payer: BC Managed Care – PPO | Admitting: Endocrinology

## 2014-07-05 ENCOUNTER — Encounter: Payer: Self-pay | Admitting: Endocrinology

## 2014-07-05 ENCOUNTER — Ambulatory Visit (INDEPENDENT_AMBULATORY_CARE_PROVIDER_SITE_OTHER): Payer: BC Managed Care – PPO | Admitting: Endocrinology

## 2014-07-05 VITALS — BP 132/83 | HR 71 | Temp 98.0°F | Resp 14 | Ht 63.5 in | Wt 131.6 lb

## 2014-07-05 DIAGNOSIS — E890 Postprocedural hypothyroidism: Secondary | ICD-10-CM

## 2014-07-05 LAB — TSH: TSH: 0.03 u[IU]/mL — ABNORMAL LOW (ref 0.35–4.50)

## 2014-07-05 LAB — T4, FREE: Free T4: 0.55 ng/dL — ABNORMAL LOW (ref 0.60–1.60)

## 2014-07-05 NOTE — Progress Notes (Signed)
Patient ID: Deborah Banks, female   DOB: [birth year], 58 y.o.   MRN: [id number]   Reason for Appointment:  Hyperthyroidism, followup  History of Present Illness:   The Hyperthyroidism was first diagnosed in 09/2013  Initial presentation: Had chest pain; also was having symptoms of palpitations, shortness of breath for about a month prior to admission. Also had nervousness at times, fatigue and mild heat intolerance She had lost 30 pounds since about June She also  had swelling of her legs especially left. Treated empirically with Lasix in the hospital She had been on methimazole 10 mg twice a day since 09/21/13 without side effects  Recent history: Because of her need for persistent antithyroid drugs she was given 15 mCi I-131 treatment on 04/11/14 Her methimazole was stopped subsequently However she did not followup as directed and has not had any thyroid levels checked either. Currently she feels fairly good and her energy level No palpitations, heat intolerance as before; she however is tending to be cold intolerance recently Weight has gone up only a couple of months Also not on metoprolol   Wt Readings from Last 3 Encounters:  07/05/14 131 lb 9.6 oz (59.693 kg)  05/01/14 128 lb (58.06 kg)  03/29/14 129 lb (58.514 kg)    Lab Results  Component Value Date   FREET4 1.25 05/13/2014   FREET4 1.05 05/01/2014   FREET4 1.51 03/29/2014   TSH 0.03* 05/13/2014   TSH 0.01* 03/29/2014   TSH 0.05* 12/24/2013             Medication List       This list is accurate as of: 07/05/14  4:14 PM.  Always use your most recent med list.               aspirin 325 MG EC tablet  Take 1 tablet (325 mg total) by mouth daily.     furosemide 20 MG tablet  Commonly known as:  LASIX  Take 1 tablet (20 mg total) by mouth daily.     methimazole 10 MG tablet  Commonly known as:  TAPAZOLE  Take 2 tablets (20 mg total) by mouth daily.     potassium chloride SA 20 MEQ  tablet  Commonly known as:  K-DUR,KLOR-CON  Take 2 tablets daily for 2 days, then decrease to 1 tablet daily for 15 days.     spironolactone 50 MG tablet  Commonly known as:  ALDACTONE  Take 1 tablet (50 mg total) by mouth daily.            Past Medical History  Diagnosis Date  . PAF (paroxysmal atrial fibrillation)     a. During 09/2013 adm for CP - normal cors, no PE.  Marland Kitchen Paroxysmal atrial flutter     a. During 09/2013 adm for CP - normal cors, no PE.  Marland Kitchen Abnormal CT scan, chest     a. mild mediastinal lymphadenopathy - instructed to f/u PCP.  . Microscopic hematuria     a. During 09/2013 adm.  . Elevated alkaline phosphatase level     a. During 09/2013 adm.  . Hypokalemia   . Diastolic CHF     a. During 09/2013 adm for CP - normal cors, no PE. CT angio with LVH.  Marland Kitchen GERD (gastroesophageal reflux disease)     Past Surgical History  Procedure Laterality Date  . Abdominal hysterectomy      Family History  Problem Relation Age  of Onset  . Heart attack Mother   . CAD Mother   . Diabetes Mother   . Hypertension Neg Hx   . Thyroid disease Other     Social History:  reports that she has never smoked. She has never used smokeless tobacco. She reports that she does not drink alcohol or use illicit drugs.  Allergies: No Known Allergies  Review of Systems:  She has had some continued leg edema and is going to see a vascular specialist  No  history of hypertension        Examination:   BP 132/83  Pulse 71  Temp(Src) 98 F (36.7 C)  Resp 14  Ht 5' 3.5" (1.613 m)  Wt 131 lb 9.6 oz (59.693 kg)  BMI 22.94 kg/m2  SpO2 98%   General Appearance: pleasant and looks well      Eyes:  she has minimal exophthalmos present, especially on the left. bilateral; has mild eyelid edema present Neck: The thyroid is not enlarged  Extremities:  she has 1+ edema Biceps reflexes appear normal    Assessment/Plan:  Graves' disease   She had I-131 treatment nearly 3 months ago and  currently is on no medications She appears fairly asymptomatic except for cold intolerance and is euthyroid on exam Her thyroid enlargement is not present currently Most likely she will need to start thyroid supplementation after establishing her level of thyroid function today Discussed with patient and this will need to be long-term and will need some adjustment periodically especially initially  Followup about 2 months later  [facility]Kie Calvin 07/05/2014, 4:14 PM   Free T4 low, she will start 75 mcg levothyroxine  Lab Results  Component Value Date   FREET4 0.55* 07/05/2014   FREET4 1.25 05/13/2014   FREET4 1.05 05/01/2014   TSH 0.03* 07/05/2014   TSH 0.03* 05/13/2014   TSH 0.01* 03/29/2014

## 2014-07-06 NOTE — Progress Notes (Signed)
Quick Note:  Please let patient know that the thyroid result is low and needs Rx for Synthroid 75ug qd ______

## 2014-07-08 ENCOUNTER — Other Ambulatory Visit: Payer: Self-pay | Admitting: *Deleted

## 2014-07-08 ENCOUNTER — Telehealth: Payer: Self-pay | Admitting: Endocrinology

## 2014-07-08 MED ORDER — LEVOTHYROXINE SODIUM 75 MCG PO TABS: 75.0000 ug | ORAL_TABLET | Freq: Every day | ORAL | Status: DC

## 2014-07-08 NOTE — Telephone Encounter (Signed)
Patient called returning Rhonda's phone call regarding her labs    Thank you

## 2014-09-02 ENCOUNTER — Other Ambulatory Visit (INDEPENDENT_AMBULATORY_CARE_PROVIDER_SITE_OTHER): Payer: BC Managed Care – PPO

## 2014-09-02 DIAGNOSIS — E890 Postprocedural hypothyroidism: Secondary | ICD-10-CM

## 2014-09-02 LAB — T4, FREE: Free T4: 1.41 ng/dL (ref 0.60–1.60)

## 2014-09-02 LAB — TSH: TSH: 0.06 u[IU]/mL — ABNORMAL LOW (ref 0.35–4.50)

## 2014-09-04 ENCOUNTER — Ambulatory Visit (INDEPENDENT_AMBULATORY_CARE_PROVIDER_SITE_OTHER): Payer: BC Managed Care – PPO | Admitting: Endocrinology

## 2014-09-04 ENCOUNTER — Encounter: Payer: Self-pay | Admitting: Endocrinology

## 2014-09-04 VITALS — BP 132/80 | HR 74 | Temp 98.0°F | Resp 14 | Ht 63.5 in | Wt 132.6 lb

## 2014-09-04 DIAGNOSIS — E890 Postprocedural hypothyroidism: Secondary | ICD-10-CM

## 2014-09-04 MED ORDER — LEVOTHYROXINE SODIUM 50 MCG PO TABS: 50.0000 ug | ORAL_TABLET | Freq: Every day | ORAL | Status: DC

## 2014-09-04 NOTE — Patient Instructions (Addendum)
Take 1/2 pill on weekdays and 1 on weekends Next Rx will be 50ug  Selenium 200mg  tablets twice daily

## 2014-09-04 NOTE — Progress Notes (Signed)
Patient ID: Deborah MartesBarbara Banks, female   DOB: 09-07-1956, 58 y.o.   MRN: [id number]   Reason for Appointment:  followup of thyroid  History of Present Illness:   Hyperthyroidism was first diagnosed in 09/2013  Initial presentation: Had chest pain and symptoms of palpitations, shortness of breath for about a month prior to admission. Also had nervousness at times, fatigue and mild heat intolerance She had lost 30 pounds.  She also  had swelling of her legs especially left. Treated empirically with Lasix in the hospital Because of her need for persistent antithyroid drugs she was given 15 mCi I-131 treatment on 04/11/14 and metformin stopped subsequently  Recent history: Her free T4 was low in 10/15 although at that time she was subjectively feeling fairly good except for cord intolerance In anticipation of hypothyroidism she was started on 75 g of levothyroxine which she has taken She may feel tired at times but not consistently and she is still complaining about cold intolerance No recent weight change Also no recurrence of symptoms of palpitations or shakiness  She is however complaining about her eyes being bulging more for the last month and being a little dry.  Did not have any significant symptoms or prominent exophthalmos previously   Wt Readings from Last 3 Encounters:  09/04/14 132 lb 9.6 oz (60.147 kg)  07/05/14 131 lb 9.6 oz (59.693 kg)  05/01/14 128 lb (58.06 kg)    Lab Results  Component Value Date   FREET4 1.41 09/02/2014   FREET4 0.55* 07/05/2014   FREET4 1.25 05/13/2014   TSH 0.06* 09/02/2014   TSH 0.03* 07/05/2014   TSH 0.03* 05/13/2014             Medication List       This list is accurate as of: 09/04/14  3:14 PM.  Always use your most recent med list.               levothyroxine 75 MCG tablet  Commonly known as:  SYNTHROID  Take 1 tablet (75 mcg total) by mouth daily before breakfast.            Past Medical History    Diagnosis Date  . PAF (paroxysmal atrial fibrillation)     a. During 09/2013 adm for CP - normal cors, no PE.  Marland Kitchen. Paroxysmal atrial flutter     a. During 09/2013 adm for CP - normal cors, no PE.  Marland Kitchen. Abnormal CT scan, chest     a. mild mediastinal lymphadenopathy - instructed to f/u PCP.  . Microscopic hematuria     a. During 09/2013 adm.  . Elevated alkaline phosphatase level     a. During 09/2013 adm.  . Hypokalemia   . Diastolic CHF     a. During 09/2013 adm for CP - normal cors, no PE. CT angio with LVH.  Marland Kitchen. GERD (gastroesophageal reflux disease)     Past Surgical History  Procedure Laterality Date  . Abdominal hysterectomy      Family History  Problem Relation Age of Onset  . Heart attack Mother   . CAD Mother   . Diabetes Mother   . Hypertension Neg Hx   . Thyroid disease Other     Social History:  reports that she has never smoked. She has never used smokeless tobacco. She reports that she does not drink alcohol or use illicit drugs.  Allergies: No Known Allergies  Review of Systems:  No  history of hypertension        Examination:   BP 132/80 mmHg  Pulse 74  Temp(Src) 98 F (36.7 C)  Resp 14  Ht 5' 3.5" (1.613 m)  Wt 132 lb 9.6 oz (60.147 kg)  BMI 23.12 kg/m2  SpO2 98%   General Appearance: She looks well      Eyes:  she has  exophthalmos present, measurements with exophthalmometer are 22-23.  She has bilateral prominence of the eyes and a little more lid retraction on the left side Minimal conjunctival injection Neck: The thyroid is not enlarged  Biceps reflexes appear slightly brisk   Assessment/Plan:  Graves' disease   She has had mild post ablative hypothyroidism after her I-131 treatment in 7/15 However with 75 g levothyroxine her free T4 is high normal and her TSH is suppressed and most likely does not need full replacement doses She also appears to be having more exophthalmos and some dryness of the eyes  She will reduce her dose to 50  g after completing the current prescription as directed  Also empirically try selenium for ophthalmopathy and OTC artificial tears as needed  Patient Instructions  Take 1/2 pill on weekdays and 1 on weekends Next Rx will be 50ug  Selenium 200mg  tablets twice daily    Followup about 2 months again  [facility],Daritza Brees 09/04/2014, 3:14 PM

## 2014-09-12 ENCOUNTER — Encounter (HOSPITAL_COMMUNITY): Payer: Self-pay | Admitting: Interventional Cardiology

## 2014-09-23 ENCOUNTER — Telehealth: Payer: Self-pay

## 2014-09-23 NOTE — Telephone Encounter (Signed)
Requested call back from pt to discuss flu shot.  

## 2014-10-29 ENCOUNTER — Other Ambulatory Visit (INDEPENDENT_AMBULATORY_CARE_PROVIDER_SITE_OTHER): Payer: Self-pay

## 2014-10-29 DIAGNOSIS — E890 Postprocedural hypothyroidism: Secondary | ICD-10-CM

## 2014-10-29 LAB — TSH: TSH: 0.16 u[IU]/mL — ABNORMAL LOW (ref 0.35–4.50)

## 2014-10-29 LAB — T4, FREE: FREE T4: 0.85 ng/dL (ref 0.60–1.60)

## 2014-11-05 DIAGNOSIS — Z0279 Encounter for issue of other medical certificate: Secondary | ICD-10-CM

## 2014-11-06 ENCOUNTER — Ambulatory Visit (INDEPENDENT_AMBULATORY_CARE_PROVIDER_SITE_OTHER): Payer: BLUE CROSS/BLUE SHIELD | Admitting: Endocrinology

## 2014-11-06 VITALS — BP 120/78 | HR 87 | Temp 97.7°F | Ht 63.5 in | Wt 126.2 lb

## 2014-11-06 DIAGNOSIS — E0500 Thyrotoxicosis with diffuse goiter without thyrotoxic crisis or storm: Secondary | ICD-10-CM | POA: Insufficient documentation

## 2014-11-06 DIAGNOSIS — E890 Postprocedural hypothyroidism: Secondary | ICD-10-CM

## 2014-11-06 NOTE — Progress Notes (Deleted)
Pre visit review using our clinic review tool, if applicable. No additional management support is needed unless otherwise documented below in the visit note. 

## 2014-11-06 NOTE — Progress Notes (Signed)
Patient ID: Deborah MartesBarbara Veasey, female   DOB: Jan 16, 1956, 59 y.o.   MRN: [id number]   Reason for Appointment:  followup of hypothyroidism  History of Present Illness:   Hyperthyroidism was first diagnosed in 09/2013 Initial presentation: Had chest pain and symptoms of palpitations, shortness of breath for about a month prior to admission.  Also had nervousness at times, fatigue, 30 pound weight loss and mild heat intolerance  She also  had swelling of her legs especially left. Treated empirically with Lasix in the hospital Because of her need for persistent antithyroid drugs she was given 15 mCi I-131 treatment on 04/11/14 and metformin stopped subsequently  Recent history: Her free T4 was low in 10/15 although at that time she was subjectively feeling fairly good except for cold intolerance In anticipation of hypothyroidism she was started on 75 g of levothyroxine  However in 11/15 although she was not having any hyperthyroid symptoms she was having more problems with her eyes being more prominent and also dryness. Because of high normal free T4 and suppressed TSH her dose was reduced to 50 g  Currently she does still feel tired although she is not sure if she is better.  Does not feel the same everyday. She has lost some weight.  Getting some hot flashes but no cord intolerance.  No skin or hair change. However her eye prominence is less and she is not having as much dryness  Has been compliant with her thyroid supplement and her free T4 is improved along with TSH Weight history:  Wt Readings from Last 3 Encounters:  11/06/14 126 lb 3 oz (57.238 kg)  09/04/14 132 lb 9.6 oz (60.147 kg)  07/05/14 131 lb 9.6 oz (59.693 kg)     Lab Results  Component Value Date   TSH 0.16* 10/29/2014   TSH 0.06* 09/02/2014   TSH 0.03* 07/05/2014   FREET4 0.85 10/29/2014   FREET4 1.41 09/02/2014   FREET4 0.55* 07/05/2014              Medication List       This list  is accurate as of: 11/06/14  9:11 PM.  Always use your most recent med list.               levothyroxine 50 MCG tablet  Commonly known as:  SYNTHROID, LEVOTHROID  Take 1 tablet (50 mcg total) by mouth daily.            Past Medical History  Diagnosis Date  . PAF (paroxysmal atrial fibrillation)     a. During 09/2013 adm for CP - normal cors, no PE.  Marland Kitchen. Paroxysmal atrial flutter     a. During 09/2013 adm for CP - normal cors, no PE.  Marland Kitchen. Abnormal CT scan, chest     a. mild mediastinal lymphadenopathy - instructed to f/u PCP.  . Microscopic hematuria     a. During 09/2013 adm.  . Elevated alkaline phosphatase level     a. During 09/2013 adm.  . Hypokalemia   . Diastolic CHF     a. During 09/2013 adm for CP - normal cors, no PE. CT angio with LVH.  Marland Kitchen. GERD (gastroesophageal reflux disease)     Past Surgical History  Procedure Laterality Date  . Abdominal hysterectomy    . Left heart catheterization with coronary angiogram N/A 09/03/2013    Procedure: LEFT HEART CATHETERIZATION WITH CORONARY ANGIOGRAM;  Surgeon: Corky CraftsJayadeep S Varanasi, MD;  Location: [facility] CATH [facility];  Service: Cardiovascular;  Laterality: N/A;    Family History  Problem Relation Age of Onset  . Heart attack Mother   . CAD Mother   . Diabetes Mother   . Hypertension Neg Hx   . Thyroid disease Other     Social History:  reports that she has never smoked. She has never used smokeless tobacco. She reports that she does not drink alcohol or use illicit drugs.  Allergies: No Known Allergies  Review of Systems:  No  history of hypertension       Hot flashes at times  She is going to be seen by a vascular/pain specialist for her persistent left leg edema   Examination:   BP 120/78 mmHg  Pulse 87  Temp(Src) 97.7 F (36.5 C) (Oral)  Ht 5' 3.5" (1.613 m)  Wt 126 lb 3 oz (57.238 kg)  BMI 22.00 kg/m2  SpO2 97%  She looks well      Eyes:  she has mild exophthalmos present, measurements with exophthalmometer are  21-22.  She has less prominence of the eyes without significant lid retraction or stare No tremor. 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 Currently requiring only small doses of thyroid supplementation and has good levels with 50 g levothyroxine. Her TSH which had been significantly suppressed is now relatively better Subjectively she has been doing well and she does not have as many symptoms of the ophthalmopathy Not clear why she has lost weight Objectively her eyes look better today and has less dryness with using artificial tears  Since her free T4 is in the lower end of the range will continue the same dose and follow-up in 3 months   Saidah Kempton 11/06/2014, 9:11 PM

## 2014-11-12 IMAGING — CT CT ANGIO CHEST
2 of 9 series · 18 of 46 positions shown · IV contrast (APPLIED)
Comparison: September 03, 2013 chest radiograph

CLINICAL DATA: Chest pain

EXAM:
CT ANGIOGRAPHY CHEST WITH CONTRAST
TECHNIQUE: Multidetector CT imaging of the chest was performed using the
standard protocol during bolus administration of intravenous
contrast. Multiplanar CT image reconstructions including MIPs were
obtained to evaluate the vascular anatomy.
CONTRAST:  80 mL Omnipaque 300 nonionic

[Series 5: thins · axial · 0.54mm/px · z∈[-204,-22]mm · 15 of 208 slices shown]
[im 13/208  lung]
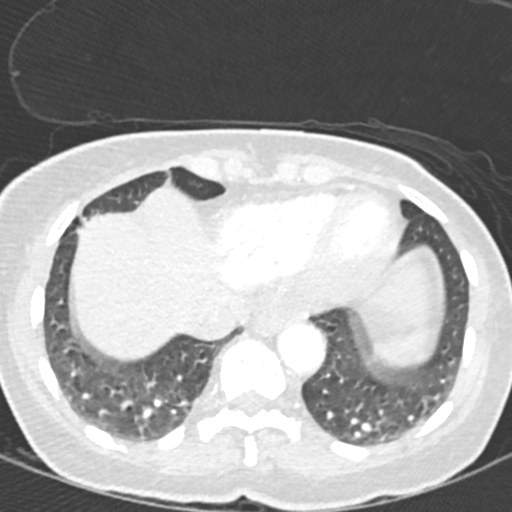
[im 25/208  soft-tissue]
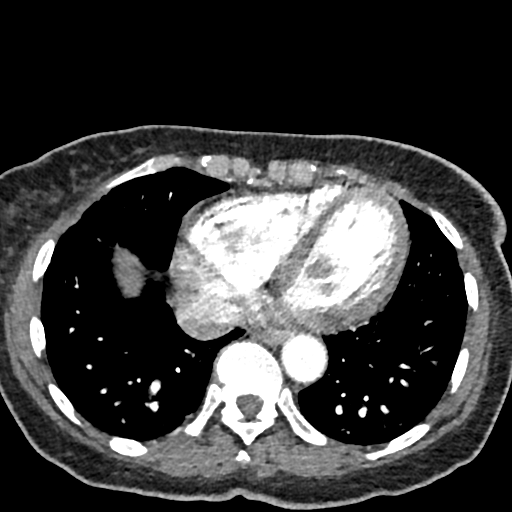
[im 37/208  lung]
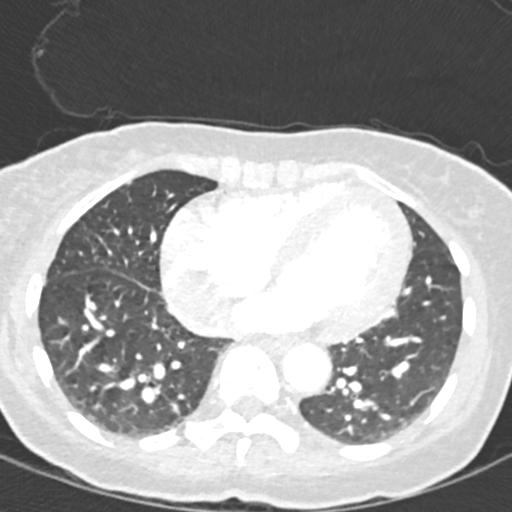
[im 49/208  soft-tissue]
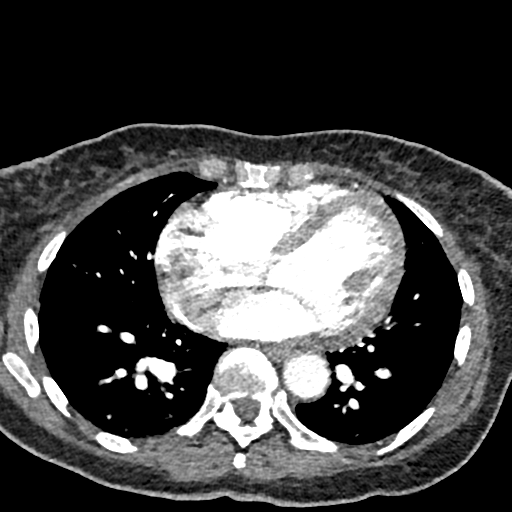
[im 61/208  lung]
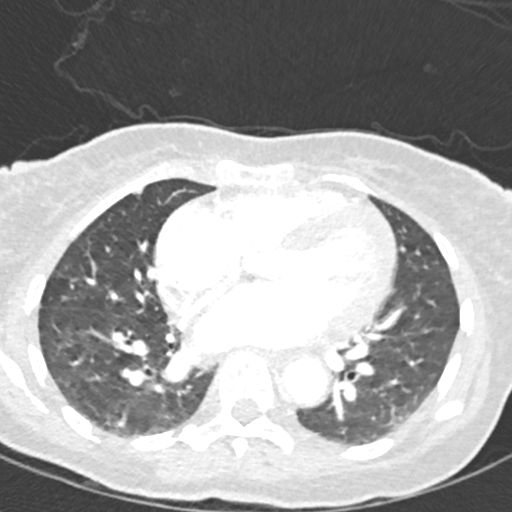
[im 74/208  soft-tissue]
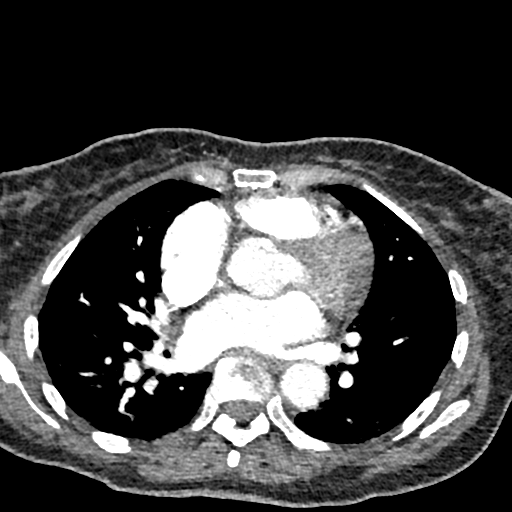
[im 86/208  lung]
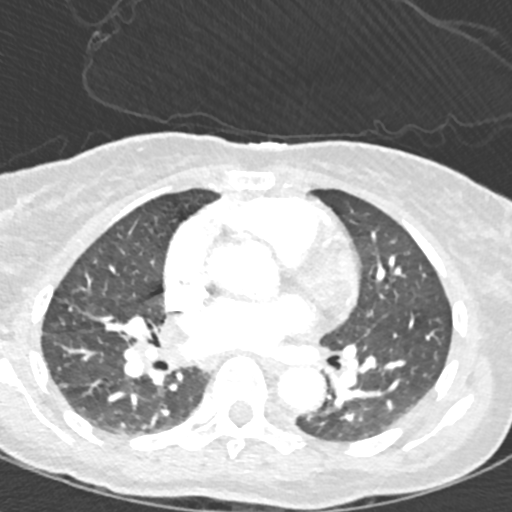
[im 110/208  soft-tissue]
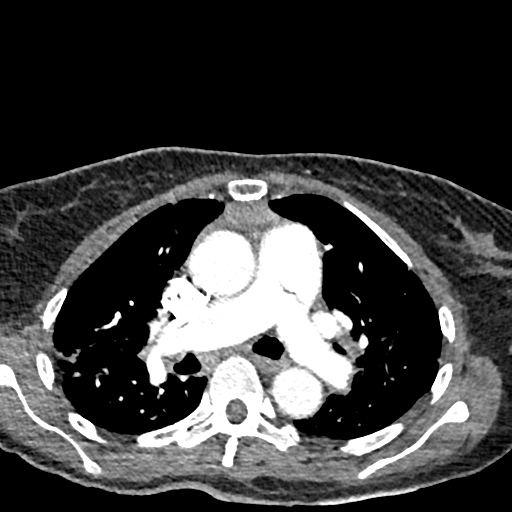
[im 122/208  lung]
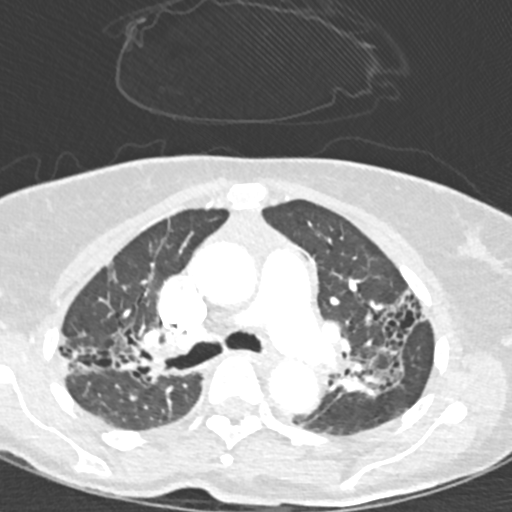
[im 134/208  soft-tissue]
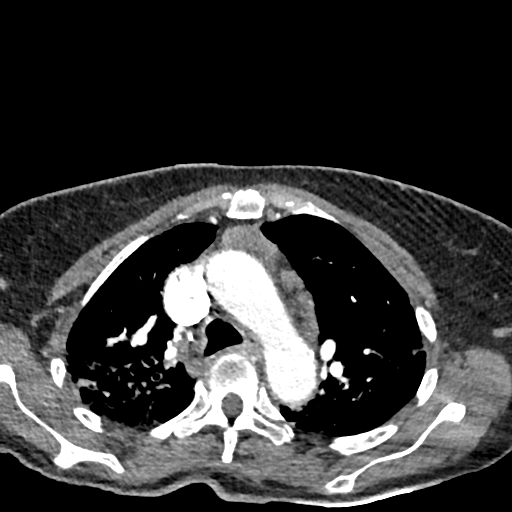
[im 147/208  lung]
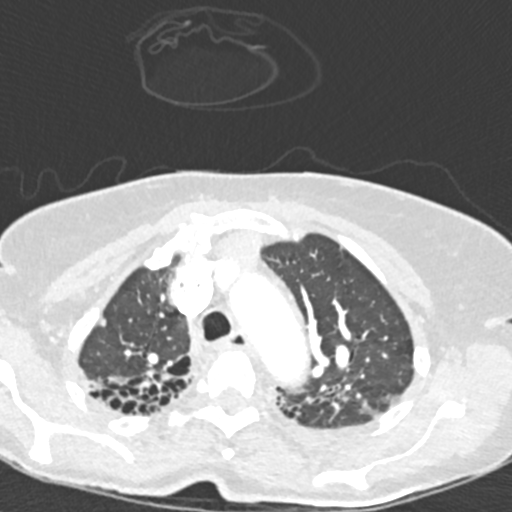
[im 159/208  soft-tissue]
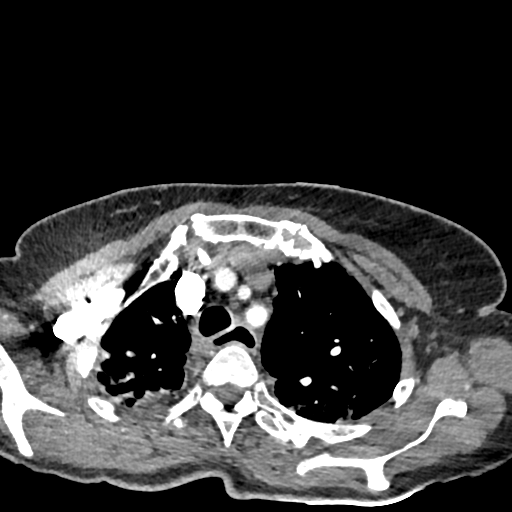
[im 171/208  lung]
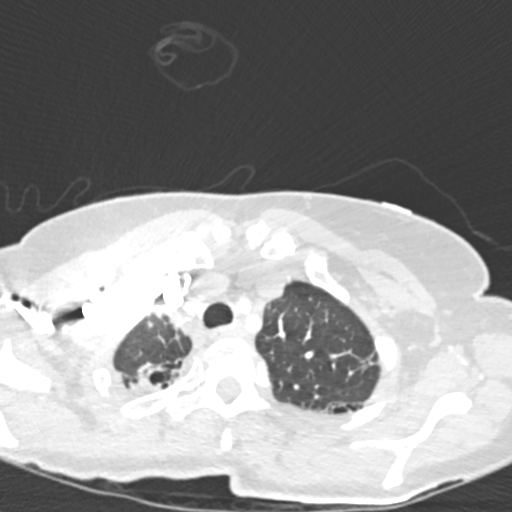
[im 183/208  soft-tissue]
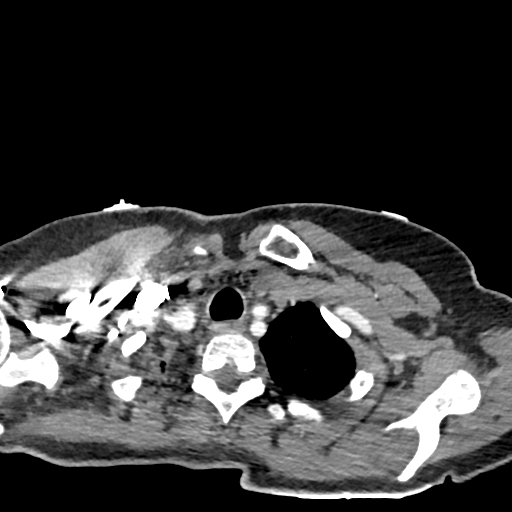
[im 195/208  lung]
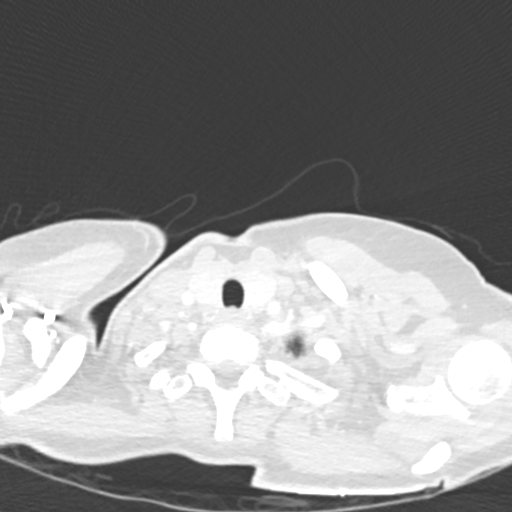

[Series 7: coronal mpr · coronal · 0.46mm/px · 3 of 82 slices shown]
[im 21/82  soft-tissue]
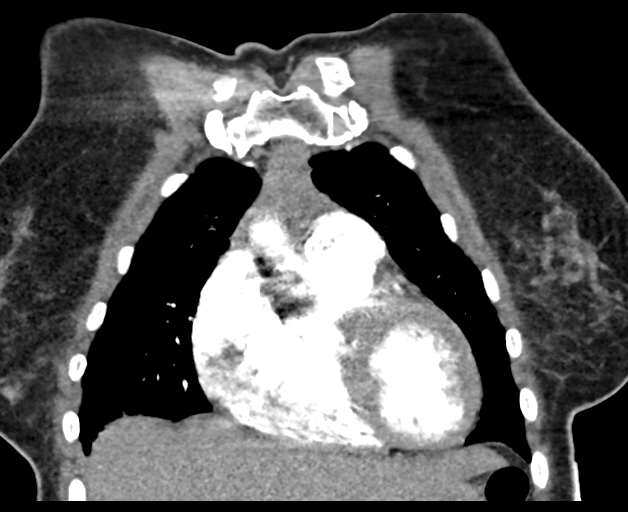
[im 41/82  soft-tissue]
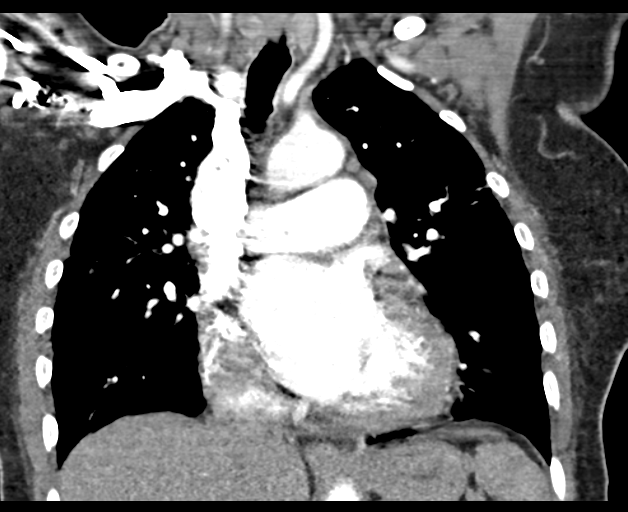
[im 61/82  soft-tissue]
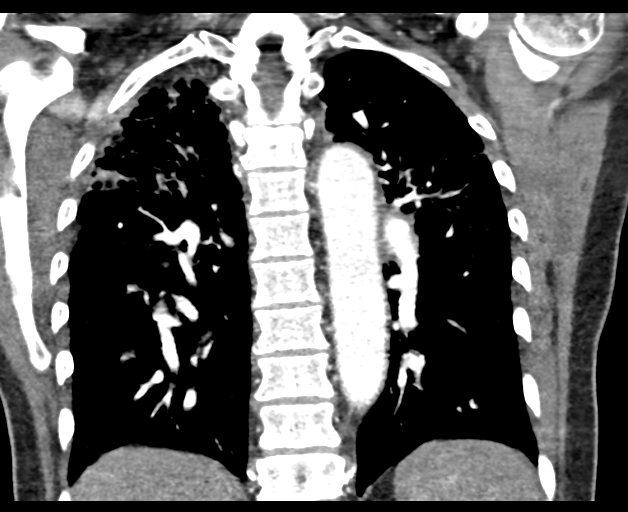

[18 of 46 positions shown; findings below may reference images not displayed]

FINDINGS: There is no demonstrable pulmonary embolus. There is no thoracic
aortic aneurysm or dissection.

There is fibrotic change in both upper lobes with honeycombing, more
on the right than on the left. There is patchy atelectasis in the
right mid lung. There is no well-defined edema or consolidation.

There is focal soft tissue prominence in the anterior mediastinum
measuring 2.4 x 1.8 cm. There is a focal right hilar lymph node
measuring 1.8 by 1.2 cm. There are several small lymph nodes
adjacent to the aortic arch.

Heart appears mildly enlarged with left ventricular hypertrophy.
There is no appreciable pericardial thickening.

Visualized upper abdominal structures appear within normal limits.
There are no blastic or lytic bone lesions.

Review of the MIP images confirms the above findings.
IMPRESSION: No demonstrable pulmonary embolus.

Fibrotic change in both upper lobes with patchy atelectatic change.

Areas of mild adenopathy. Probable mild adenopathy in the anterior
mediastinum. There is soft tissue fullness in this area. Given this
appearance, a followup study in 4 to 6 weeks may be advised to
further assess.

Heart is prominent with left ventricular hypertrophy.

## 2014-11-12 IMAGING — CR DG CHEST 1V PORT
1 series · 1 of 1 positions shown · non-contrast
Comparison: None.

CLINICAL DATA: Chest pain.  Shortness of breath on exertion.

EXAM:
PORTABLE CHEST - 1 VIEW

[view not recorded]
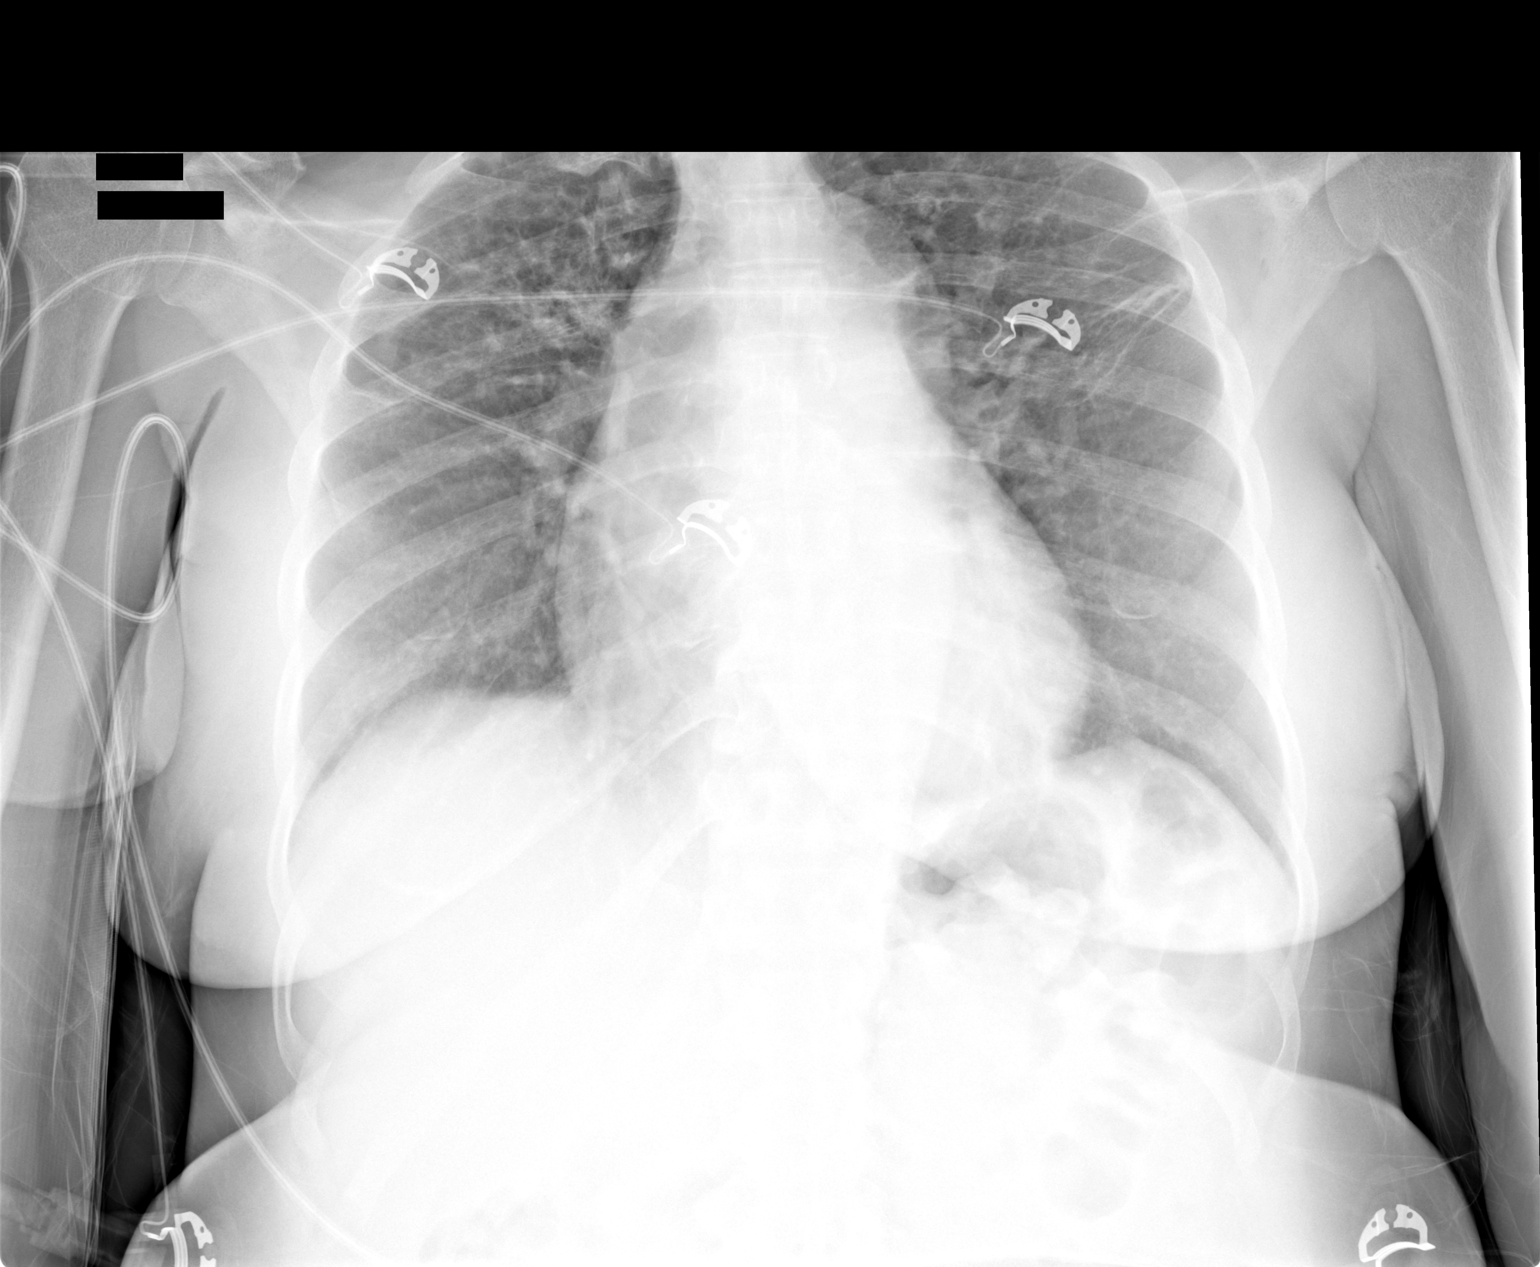

[1 of 1 positions shown; findings below may reference images not displayed]

FINDINGS: A lordotic positioning is demonstrated. Lungs are adequately
inflated with increase coarse interstitial markings over the mid to
upper lungs. No evidence of effusion. Cardiomediastinal silhouette
is within normal. There is minimal spondylosis of the spine.
IMPRESSION: Mild increase coarse interstitial markings over the mid to upper
lungs likely chronic, although cannot exclude acute interstitial
pneumonitis.

## 2014-12-24 ENCOUNTER — Telehealth: Payer: Self-pay | Admitting: *Deleted

## 2014-12-24 NOTE — Telephone Encounter (Signed)
Patient declines the flu vaccine 

## 2015-01-31 ENCOUNTER — Other Ambulatory Visit (INDEPENDENT_AMBULATORY_CARE_PROVIDER_SITE_OTHER): Payer: BLUE CROSS/BLUE SHIELD

## 2015-01-31 DIAGNOSIS — E890 Postprocedural hypothyroidism: Secondary | ICD-10-CM

## 2015-01-31 LAB — T4, FREE: Free T4: 0.89 ng/dL (ref 0.60–1.60)

## 2015-01-31 LAB — TSH: TSH: 0.07 u[IU]/mL — ABNORMAL LOW (ref 0.35–4.50)

## 2015-02-05 ENCOUNTER — Ambulatory Visit (INDEPENDENT_AMBULATORY_CARE_PROVIDER_SITE_OTHER): Payer: BLUE CROSS/BLUE SHIELD | Admitting: Endocrinology

## 2015-02-05 ENCOUNTER — Encounter: Payer: Self-pay | Admitting: Endocrinology

## 2015-02-05 VITALS — BP 134/72 | HR 77 | Temp 98.1°F | Resp 14 | Ht 63.5 in | Wt 128.8 lb

## 2015-02-05 DIAGNOSIS — E890 Postprocedural hypothyroidism: Secondary | ICD-10-CM

## 2015-02-05 MED ORDER — LEVOTHYROXINE SODIUM 50 MCG PO TABS: 50.0000 ug | ORAL_TABLET | Freq: Every day | ORAL | Status: DC

## 2015-02-05 NOTE — Progress Notes (Signed)
Patient ID: Deborah MartesBarbara Banks, female   DOB: [birth year], 59 y.o.   MRN: [id number]   Reason for Appointment:  followup of hypothyroidism  History of Present Illness:   Hyperthyroidism was first diagnosed in 09/2013 Initial presentation: Had chest pain and symptoms of palpitations, shortness of breath for about a month prior to admission.  Also had nervousness at times, fatigue, 30 pound weight loss and mild heat intolerance  She also  had swelling of her legs especially left. Treated empirically with Lasix in the hospital Because of her need for persistent antithyroid drugs she was given 15 mCi I-131 treatment on 04/11/14 and metformin stopped subsequently  Recent history: Her free T4 was low in 10/15 although at that time she was subjectively feeling fairly good except for cold intolerance In anticipation of hypothyroidism she was started on 75 g of levothyroxine  However in 11/15 although she was not having any hyperthyroid symptoms she was having more problems with her eyes being more prominent and also dryness. Because of high normal free T4 and suppressed TSH her dose was reduced to 50 g  Currently she does not feel tired  In the last week or so she has had occasional episodes where she would feel shaky for about 15 minutes but no associated palpitations or dizziness She has lost some weight.  Getting some hot flashes but no cold intolerance.   No skin or hair change.  Has been compliant with her thyroid supplement and her free T4 is stable but her TSH is still suppressed  Weight history:  Wt Readings from Last 3 Encounters:  02/05/15 128 lb 12.8 oz (58.423 kg)  11/06/14 126 lb 3 oz (57.238 kg)  09/04/14 132 lb 9.6 oz (60.147 kg)     Lab Results  Component Value Date   TSH 0.07* 01/31/2015   TSH 0.16* 10/29/2014   TSH 0.06* 09/02/2014   FREET4 0.89 01/31/2015   FREET4 0.85 10/29/2014   FREET4 1.41 09/02/2014              Medication List       This list is accurate as of: 02/05/15  3:23 PM.  Always use your most recent med list.               levothyroxine 50 MCG tablet  Commonly known as:  SYNTHROID, LEVOTHROID  Take 1 tablet (50 mcg total) by mouth daily.            Past Medical History  Diagnosis Date  . PAF (paroxysmal atrial fibrillation)     a. During 09/2013 adm for CP - normal cors, no PE.  Marland Kitchen. Paroxysmal atrial flutter     a. During 09/2013 adm for CP - normal cors, no PE.  Marland Kitchen. Abnormal CT scan, chest     a. mild mediastinal lymphadenopathy - instructed to f/u PCP.  . Microscopic hematuria     a. During 09/2013 adm.  . Elevated alkaline phosphatase level     a. During 09/2013 adm.  . Hypokalemia   . Diastolic CHF     a. During 09/2013 adm for CP - normal cors, no PE. CT angio with LVH.  Marland Kitchen. GERD (gastroesophageal reflux disease)     Past Surgical History  Procedure Laterality Date  . Abdominal hysterectomy    . Left heart catheterization with coronary angiogram N/A 09/03/2013    Procedure: LEFT HEART CATHETERIZATION WITH CORONARY ANGIOGRAM;  Surgeon: Corky CraftsJayadeep S Varanasi,  MD;  Location: [facility] CATH [facility];  Service: Cardiovascular;  Laterality: N/A;    Family History  Problem Relation Age of Onset  . Heart attack Mother   . CAD Mother   . Diabetes Mother   . Hypertension Neg Hx   . Thyroid disease Other     Social History:  reports that she has never smoked. She has never used smokeless tobacco. She reports that she does not drink alcohol or use illicit drugs.  Allergies: No Known Allergies  Review of Systems:  No  history of hypertension       Hot flashes present at times    Examination:   BP 134/72 mmHg  Pulse 77  Temp(Src) 98.1 F (36.7 C)  Resp 14  Ht 5' 3.5" (1.613 m)  Wt 128 lb 12.8 oz (58.423 kg)  BMI 22.46 kg/m2  SpO2 99%  She looks well      Eyes:  she has mild exophthalmos present.  She has  prominence of the eyes with mild lid retraction especially lower but no stare or lid  lag Thyroid is just palpable on the right side, soft No tremor. 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 Still requiring only small doses of thyroid supplementation and has good free T4 level, currently on 50 g levothyroxine However TSH is still suppressed and relatively lower than the last time  Since she has a small remnant right lobe enlargement present she may still have mild active Graves' disease However free T4 is stable and somewhat below average levels and she will need to be continued on small doses of thyroid supplement  Will also check free T3 level on her next visit in 4 months She will call if she has any new problems with unusual fatigue or persistent tremor or shakiness/of palpitations    Deborah Banks 02/05/2015, 3:23 PM

## 2015-06-06 ENCOUNTER — Other Ambulatory Visit: Payer: BC Managed Care – PPO

## 2015-06-11 ENCOUNTER — Ambulatory Visit: Payer: BC Managed Care – PPO | Admitting: Endocrinology

## 2015-06-23 ENCOUNTER — Other Ambulatory Visit: Payer: BC Managed Care – PPO

## 2015-06-23 ENCOUNTER — Other Ambulatory Visit (INDEPENDENT_AMBULATORY_CARE_PROVIDER_SITE_OTHER): Payer: BLUE CROSS/BLUE SHIELD

## 2015-06-23 DIAGNOSIS — E890 Postprocedural hypothyroidism: Secondary | ICD-10-CM

## 2015-06-23 LAB — TSH: TSH: 0.07 u[IU]/mL — AB (ref 0.35–4.50)

## 2015-06-23 LAB — T3, FREE: T3 FREE: 3.3 pg/mL (ref 2.3–4.2)

## 2015-06-23 LAB — T4, FREE: Free T4: 1.02 ng/dL (ref 0.60–1.60)

## 2015-06-27 ENCOUNTER — Ambulatory Visit (INDEPENDENT_AMBULATORY_CARE_PROVIDER_SITE_OTHER): Payer: BLUE CROSS/BLUE SHIELD | Admitting: Endocrinology

## 2015-06-27 VITALS — BP 132/68 | HR 77 | Temp 98.4°F | Resp 14 | Ht 63.5 in | Wt 130.0 lb

## 2015-06-27 DIAGNOSIS — E890 Postprocedural hypothyroidism: Secondary | ICD-10-CM | POA: Diagnosis not present

## 2015-06-27 NOTE — Progress Notes (Signed)
Patient ID: Deborah Banks, female   DOB: Jan 26, 1956, 59 y.o.   MRN: [id number]   Reason for Appointment:  followup of hypothyroidism  History of Present Illness:    She previously had hypothyroidism,  first diagnosed in 09/2013 presenting with symptoms of palpitations, shortness of breath  Because of her need for persistent antithyroid drugs she was given 15 mCi I-131 treatment on 04/11/14    Recent history: Her free T4 was low in 10/15 although at that time she was subjectively feeling fairly good except for cold intolerance In anticipation of hypothyroidism she was started on 75 g of levothyroxine  However in 11/15 although she was not having any hyperthyroid symptoms she was having more problems with her eyes being more prominent and also dryness. Because of high normal free T4 and suppressed TSH her dose was reduced to 50 g which has been continued unchanged  Currently she does not feel tired and overall feels good with relatively stable weight, has had mild gradual increase  Has been compliant with her thyroid supplement   Her TSH continues to be suppressed and now her free T4 is relatively higher than before Free T3 is normal  Weight history:  Wt Readings from Last 3 Encounters:  06/27/15 130 lb (58.968 kg)  02/05/15 128 lb 12.8 oz (58.423 kg)  11/06/14 126 lb 3 oz (57.238 kg)     Lab Results  Component Value Date   TSH 0.07* 06/23/2015   TSH 0.07* 01/31/2015   TSH 0.16* 10/29/2014   FREET4 1.02 06/23/2015   FREET4 0.89 01/31/2015   FREET4 0.85 10/29/2014              Medication List       This list is accurate as of: 06/27/15  4:14 PM.  Always use your most recent med list.               levothyroxine 50 MCG tablet  Commonly known as:  SYNTHROID, LEVOTHROID  Take 1 tablet (50 mcg total) by mouth daily.            Past Medical History  Diagnosis Date  . PAF (paroxysmal atrial fibrillation)     a. During 09/2013 adm for  CP - normal cors, no PE.  Marland Kitchen Paroxysmal atrial flutter     a. During 09/2013 adm for CP - normal cors, no PE.  Marland Kitchen Abnormal CT scan, chest     a. mild mediastinal lymphadenopathy - instructed to f/u PCP.  . Microscopic hematuria     a. During 09/2013 adm.  . Elevated alkaline phosphatase level     a. During 09/2013 adm.  . Hypokalemia   . Diastolic CHF     a. During 09/2013 adm for CP - normal cors, no PE. CT angio with LVH.  Marland Kitchen GERD (gastroesophageal reflux disease)     Past Surgical History  Procedure Laterality Date  . Abdominal hysterectomy    . Left heart catheterization with coronary angiogram N/A 09/03/2013    Procedure: LEFT HEART CATHETERIZATION WITH CORONARY ANGIOGRAM;  Surgeon: Corky Crafts, MD;  Location: [facility] CATH LAB;  Service: Cardiovascular;  Laterality: N/A;    Family History  Problem Relation Age of Onset  . Heart attack Mother   . CAD Mother   . Diabetes Mother   . Hypertension Neg Hx   . Thyroid disease Other     Social History:  reports that she has never  smoked. She has never used smokeless tobacco. She reports that she does not drink alcohol or use illicit drugs.  Allergies: No Known Allergies  Review of Systems:  No active cardiac problems at this time  She does not think her eyes feel irritated  Hot flashes present at times    Examination:   BP 132/68 mmHg  Pulse 77  Temp(Src) 98.4 F (36.9 C)  Resp 14  Ht 5' 3.5" (1.613 m)  Wt 130 lb (58.968 kg)  BMI 22.66 kg/m2  SpO2 97%  She looks well      Eyes:  she has mild exophthalmos present.  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 Still requiring only small doses of thyroid supplementation, has been on stable dose of 50 g However TSH is still suppressed and free T4 is slightly higher than before without increase in free T3  level  She probably still has functioning thyroid tissue as she is having a suppressed TSH Because of the relatively higher free T4 will reduce her dose to 37.5 g daily She can use up her 50 g dose and take 1 alternating with half tablet  Follow-up in 3 months again    [facility] 06/27/2015, 4:14 PM

## 2015-06-27 NOTE — Patient Instructions (Signed)
Take 1 pill alternating with 1/2 of the 50ug dose

## 2015-06-29 MED ORDER — LEVOTHYROXINE SODIUM 25 MCG PO TABS: ORAL_TABLET | ORAL | Status: DC

## 2015-09-23 ENCOUNTER — Other Ambulatory Visit: Payer: BC Managed Care – PPO

## 2015-09-26 ENCOUNTER — Ambulatory Visit: Payer: BC Managed Care – PPO | Admitting: Endocrinology

## 2015-10-28 ENCOUNTER — Other Ambulatory Visit (INDEPENDENT_AMBULATORY_CARE_PROVIDER_SITE_OTHER): Payer: BLUE CROSS/BLUE SHIELD

## 2015-10-28 DIAGNOSIS — E890 Postprocedural hypothyroidism: Secondary | ICD-10-CM | POA: Diagnosis not present

## 2015-10-28 LAB — T4, FREE: Free T4: 0.9 ng/dL (ref 0.60–1.60)

## 2015-10-28 LAB — TSH: TSH: 0.07 u[IU]/mL — ABNORMAL LOW (ref 0.35–4.50)

## 2015-10-31 ENCOUNTER — Ambulatory Visit: Payer: BLUE CROSS/BLUE SHIELD | Admitting: Endocrinology

## 2015-11-21 ENCOUNTER — Encounter: Payer: Self-pay | Admitting: Endocrinology

## 2015-11-21 ENCOUNTER — Ambulatory Visit (INDEPENDENT_AMBULATORY_CARE_PROVIDER_SITE_OTHER): Payer: BLUE CROSS/BLUE SHIELD | Admitting: Endocrinology

## 2015-11-21 VITALS — BP 124/74 | HR 70 | Temp 97.8°F | Resp 14 | Ht 63.5 in | Wt 138.8 lb

## 2015-11-21 DIAGNOSIS — E890 Postprocedural hypothyroidism: Secondary | ICD-10-CM | POA: Diagnosis not present

## 2015-11-21 NOTE — Progress Notes (Signed)
Patient ID: Deborah Banks, female   DOB: 1956/07/15, 60 y.o.   MRN: [id number]   Reason for Appointment:  followup of hypothyroidism  History of Present Illness:   Background information: She previously had hyperthyroidism,  first diagnosed in 09/2013 presenting with symptoms of palpitations, shortness of breath  Because of her need for persistent antithyroid drugs she was given 15 mCi I-131 treatment on 04/11/14    HISTORY: Her free T4 was low in 10/15 although at that time she was subjectively feeling fairly good except for cold intolerance  In anticipation of hypothyroidism she was started on 75 g of levothyroxine  However in 11/15 although she was not having any hyperthyroid symptoms she was having more problems with her eyes being more prominent and also dryness. Because of high normal free T4 and suppressed TSH her dose was reduced to 50 g   This was further reduced to 37.5 g in 9/16 and at that time she was feeling fairly good overall  Currently she does  feel significantly tired for the last few months She did not come back to follow-up in December as directed Her weight has gone up further Does not complain of any cold intolerance but does feel hot flashes at times No palpitations or shakiness She does complain of dry eyes and is not taking any treatment  Has been compliant with her thyroid supplement   Her TSH continues to be suppressed  Free T3 is normal as of 9/16  Weight history:  Wt Readings from Last 3 Encounters:  11/21/15 138 lb 12.8 oz (62.959 kg)  06/27/15 130 lb (58.968 kg)  02/05/15 128 lb 12.8 oz (58.423 kg)     Lab Results  Component Value Date   TSH 0.07* 10/28/2015   TSH 0.07* 06/23/2015   TSH 0.07* 01/31/2015   FREET4 0.90 10/28/2015   FREET4 1.02 06/23/2015   FREET4 0.89 01/31/2015         Lab Results  Component Value Date   T3FREE 3.3 06/23/2015   T3FREE 3.0 11/09/2013        Medication List         This list is accurate as of: 11/21/15  9:54 PM.  Always use your most recent med list.               levothyroxine 25 MCG tablet  Commonly known as:  SYNTHROID, LEVOTHROID  1-1/2 tablets daily            Past Medical History  Diagnosis Date  . PAF (paroxysmal atrial fibrillation) (HCC)     a. During 09/2013 adm for CP - normal cors, no PE.  Marland Kitchen Paroxysmal atrial flutter (HCC)     a. During 09/2013 adm for CP - normal cors, no PE.  Marland Kitchen Abnormal CT scan, chest     a. mild mediastinal lymphadenopathy - instructed to f/u PCP.  . Microscopic hematuria     a. During 09/2013 adm.  . Elevated alkaline phosphatase level     a. During 09/2013 adm.  . Hypokalemia   . Diastolic CHF (HCC)     a. During 09/2013 adm for CP - normal cors, no PE. CT angio with LVH.  Marland Kitchen GERD (gastroesophageal reflux disease)     Past Surgical History  Procedure Laterality Date  . Abdominal hysterectomy    . Left heart catheterization with coronary angiogram N/A 09/03/2013    Procedure: LEFT HEART CATHETERIZATION WITH CORONARY ANGIOGRAM;  Surgeon: Corky Crafts, MD;  Location: [facility] CATH LAB;  Service: Cardiovascular;  Laterality: N/A;    Family History  Problem Relation Age of Onset  . Heart attack Mother   . CAD Mother   . Diabetes Mother   . Hypertension Neg Hx   . Thyroid disease Other     Social History:  reports that she has never smoked. She has never used smokeless tobacco. She reports that she does not drink alcohol or use illicit drugs.  Allergies: No Known Allergies  Review of Systems:   He has not been seen by her PCP for some time    Examination:   BP 124/74 mmHg  Pulse 70  Temp(Src) 97.8 F (36.6 C)  Resp 14  Ht 5' 3.5" (1.613 m)  Wt 138 lb 12.8 oz (62.959 kg)  BMI 24.20 kg/m2  SpO2 99%  She looks well      Eyes:  she has mild exophthalmos present with mild lid retraction.  Biceps reflexes are normal No tremor   Assessment/Plan:  Graves' disease with ophthalmopathy  and post-ablative hypothyroidism   She has had mild post ablative hypothyroidism after her I-131 treatment in 7/15. Still requiring only small doses of thyroid supplementation and even with 37.5 g her TSH is low However free T4 is still below 1.0 Not clear why she is having significant fatigue as he does not have any overt hyperthyroidism  For now will reduce her dose to 25 g She needs to follow-up with PCP to evaluate her fatigue  Advised her to use artificial tears OTC for dry eyes  Follow-up in 4 months again    [facility] 11/21/2015, 9:54 PM

## 2015-11-21 NOTE — Patient Instructions (Signed)
Take only 1 pill daily 

## 2016-03-16 ENCOUNTER — Other Ambulatory Visit (INDEPENDENT_AMBULATORY_CARE_PROVIDER_SITE_OTHER): Payer: BLUE CROSS/BLUE SHIELD

## 2016-03-16 DIAGNOSIS — E890 Postprocedural hypothyroidism: Secondary | ICD-10-CM

## 2016-03-16 LAB — T3, FREE: T3, Free: 2.4 pg/mL (ref 2.3–4.2)

## 2016-03-16 LAB — TSH: TSH: 1.09 u[IU]/mL (ref 0.35–4.50)

## 2016-03-16 LAB — T4, FREE: FREE T4: 0.68 ng/dL (ref 0.60–1.60)

## 2016-03-19 ENCOUNTER — Encounter: Payer: Self-pay | Admitting: Endocrinology

## 2016-03-19 ENCOUNTER — Ambulatory Visit (INDEPENDENT_AMBULATORY_CARE_PROVIDER_SITE_OTHER): Payer: BLUE CROSS/BLUE SHIELD | Admitting: Endocrinology

## 2016-03-19 VITALS — BP 136/82 | HR 74 | Wt 138.0 lb

## 2016-03-19 DIAGNOSIS — E890 Postprocedural hypothyroidism: Secondary | ICD-10-CM

## 2016-03-19 NOTE — Progress Notes (Signed)
Patient ID: Deborah MartesBarbara Banks, female   DOB: 1956/09/15, 60 y.o.   MRN: [id number]   Reason for Appointment:  followup of hypothyroidism  History of Present Illness:   Background information: She previously had hyperthyroidism,  first diagnosed in 09/2013 presenting with symptoms of palpitations, shortness of breath  Because of her need for persistent antithyroid drugs she was given 15 mCi I-131 treatment on 04/11/14    HISTORY: Her free T4 was low in 10/15 although at that time she was subjectively feeling fairly good except for cold intolerance  In anticipation of hypothyroidism she was started on 75 g of levothyroxine  However in 11/15 although she was not having any hyperthyroid symptoms she was having more problems with her eyes being more prominent and also dryness. Because of high normal free T4 and suppressed TSH her dose was reduced to 50 g  This was further reduced to 37.5 g in 9/16 and at that time she was feeling fairly good overall  On her last visit in 2/17 because of her TSH being still relatively low her dose was reduced to 25 g but she continued to take 37.5 However for the last 6 weeks or so she ran out of her refills and did not ask for more.  Has not taken any medication Currently she does  feel any more tired than usual Her weight is stable She did not come back to follow-up in December as directed Does not complain of any cold intolerance but does feel hot flashes at times  She does complain of dry eyes and is not taking any treatment  Her TSH is now back to normal Free T3 is low normal as also free T4  Weight history:  Wt Readings from Last 3 Encounters:  03/19/16 138 lb (62.596 kg)  11/21/15 138 lb 12.8 oz (62.959 kg)  06/27/15 130 lb (58.968 kg)     Lab Results  Component Value Date   TSH 1.09 03/16/2016   TSH 0.07* 10/28/2015   TSH 0.07* 06/23/2015   FREET4 0.68 03/16/2016   FREET4 0.90 10/28/2015   FREET4 1.02  06/23/2015         Lab Results  Component Value Date   T3FREE 2.4 03/16/2016   T3FREE 3.3 06/23/2015        Medication List       This list is accurate as of: 03/19/16  4:15 PM.  Always use your most recent med list.               levothyroxine 25 MCG tablet  Commonly known as:  SYNTHROID, LEVOTHROID  1-1/2 tablets daily            Past Medical History  Diagnosis Date  . PAF (paroxysmal atrial fibrillation) (HCC)     a. During 09/2013 adm for CP - normal cors, no PE.  Marland Kitchen. Paroxysmal atrial flutter (HCC)     a. During 09/2013 adm for CP - normal cors, no PE.  Marland Kitchen. Abnormal CT scan, chest     a. mild mediastinal lymphadenopathy - instructed to f/u PCP.  . Microscopic hematuria     a. During 09/2013 adm.  . Elevated alkaline phosphatase level     a. During 09/2013 adm.  . Hypokalemia   . Diastolic CHF (HCC)     a. During 09/2013 adm for CP - normal cors, no PE. CT angio with LVH.  Marland Kitchen. GERD (gastroesophageal reflux disease)  Past Surgical History  Procedure Laterality Date  . Abdominal hysterectomy    . Left heart catheterization with coronary angiogram N/A 09/03/2013    Procedure: LEFT HEART CATHETERIZATION WITH CORONARY ANGIOGRAM;  Surgeon: Corky Crafts, MD;  Location: [facility] CATH LAB;  Service: Cardiovascular;  Laterality: N/A;    Family History  Problem Relation Age of Onset  . Heart attack Mother   . CAD Mother   . Diabetes Mother   . Hypertension Neg Hx   . Thyroid disease Other     Social History:  reports that she has never smoked. She has never used smokeless tobacco. She reports that she does not drink alcohol or use illicit drugs.  Allergies: No Known Allergies  Review of Systems:  Not on any antihypertensives    Examination:   BP 136/82 mmHg  Pulse 74  Wt 138 lb (62.596 kg)  SpO2 96%  She looks well      Eyes:  she has mild exophthalmos present with mild lid retraction.  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 Although she was told to take only 25 g of levothyroxine supplement she has not taken any in the last 6 weeks as she did not However subjectively she does not feel any worse This is despite her free T4 being low-normal along with low free T3  Currently however her TSH is normal Since she is asymptomatic will continue to observe her without any thyroxine supplements but will need regular follow-up  Follow-up in 4 months again    [facility] 03/19/2016, 4:15 PM

## 2016-07-14 ENCOUNTER — Other Ambulatory Visit: Payer: BC Managed Care – PPO

## 2016-07-19 ENCOUNTER — Ambulatory Visit: Payer: BC Managed Care – PPO | Admitting: Endocrinology

## 2016-07-30 ENCOUNTER — Other Ambulatory Visit: Payer: BC Managed Care – PPO

## 2016-07-30 ENCOUNTER — Other Ambulatory Visit: Payer: BLUE CROSS/BLUE SHIELD

## 2016-08-04 ENCOUNTER — Ambulatory Visit: Payer: BLUE CROSS/BLUE SHIELD | Admitting: Endocrinology

## 2016-08-20 ENCOUNTER — Other Ambulatory Visit (INDEPENDENT_AMBULATORY_CARE_PROVIDER_SITE_OTHER): Payer: BLUE CROSS/BLUE SHIELD

## 2016-08-20 DIAGNOSIS — E890 Postprocedural hypothyroidism: Secondary | ICD-10-CM

## 2016-08-20 LAB — TSH: TSH: 5.87 u[IU]/mL — AB (ref 0.35–4.50)

## 2016-08-20 LAB — T4, FREE: Free T4: 0.56 ng/dL — ABNORMAL LOW (ref 0.60–1.60)

## 2016-08-24 ENCOUNTER — Ambulatory Visit (INDEPENDENT_AMBULATORY_CARE_PROVIDER_SITE_OTHER): Payer: BLUE CROSS/BLUE SHIELD | Admitting: Endocrinology

## 2016-08-24 ENCOUNTER — Encounter: Payer: Self-pay | Admitting: Endocrinology

## 2016-08-24 VITALS — BP 120/74 | HR 63 | Temp 98.7°F | Resp 16 | Ht 64.0 in | Wt 133.0 lb

## 2016-08-24 DIAGNOSIS — E890 Postprocedural hypothyroidism: Secondary | ICD-10-CM

## 2016-08-24 MED ORDER — LEVOTHYROXINE SODIUM 25 MCG PO TABS: ORAL_TABLET | ORAL | 2 refills | Status: DC

## 2016-08-24 NOTE — Progress Notes (Signed)
Patient ID: Deborah MartesBarbara Banks, female   DOB: [birth year], 60 y.o.   MRN: [id number]   Reason for Appointment:  followup of hypothyroidism  History of Present Illness:   Background information: She previously had hyperthyroidism,  first diagnosed in 09/2013 presenting with symptoms of palpitations, shortness of breath  Because of her need for persistent antithyroid drugs she was given 15 mCi I-131 treatment on 04/11/14    HISTORY: Her free T4 was low in 10/15 although at that time she was subjectively feeling fairly good except for cold intolerance  In anticipation of hypothyroidism she was started on 75 g of levothyroxine  However in 11/15 although she was not having any hyperthyroid symptoms she was having more problems with her eyes being more prominent and also dryness. Because of high normal free T4 and suppressed TSH her dose was reduced to 50 g  This was further reduced to 37.5 g in 9/16 and at that time she was feeling fairly good overall  On her last visit in 6/17 she had run out of her levothyroxine medication for 6 weeks, was taking 37.5 g Since her TSH was normal at 1.1 she was told to hold off on restarting  She is not complaining of any unusual fatigue Does feel more cold recently and her weight is down slightly  She does complain of dry eyes but this is mild and is not taking any treatment  Her TSH is now back to increased levels at 5.9 Free T4 is low  Weight history:  Wt Readings from Last 3 Encounters:  08/24/16 133 lb (60.3 kg)  03/19/16 138 lb (62.6 kg)  11/21/15 138 lb 12.8 oz (63 kg)     Lab Results  Component Value Date   TSH 5.87 (H) 08/20/2016   TSH 1.09 03/16/2016   TSH 0.07 (L) 10/28/2015   FREET4 0.56 (L) 08/20/2016   FREET4 0.68 03/16/2016   FREET4 0.90 10/28/2015         Lab Results  Component Value Date   T3FREE 2.4 03/16/2016   T3FREE 3.3 06/23/2015        Medication List    as of 08/24/2016  3:53 PM     You have not been prescribed any medications.         Past Medical History:  Diagnosis Date  . Abnormal CT scan, chest    a. mild mediastinal lymphadenopathy - instructed to f/u PCP.  . Diastolic CHF (HCC)    a. During 09/2013 adm for CP - normal cors, no PE. CT angio with LVH.  Marland Kitchen. Elevated alkaline phosphatase level    a. During 09/2013 adm.  Marland Kitchen. GERD (gastroesophageal reflux disease)   . Hypokalemia   . Microscopic hematuria    a. During 09/2013 adm.  Marland Kitchen. PAF (paroxysmal atrial fibrillation) (HCC)    a. During 09/2013 adm for CP - normal cors, no PE.  Marland Kitchen. Paroxysmal atrial flutter (HCC)    a. During 09/2013 adm for CP - normal cors, no PE.    Past Surgical History:  Procedure Laterality Date  . ABDOMINAL HYSTERECTOMY    . LEFT HEART CATHETERIZATION WITH CORONARY ANGIOGRAM N/A 09/03/2013   Procedure: LEFT HEART CATHETERIZATION WITH CORONARY ANGIOGRAM;  Surgeon: Corky CraftsJayadeep S Varanasi, MD;  [facility];  Service: Cardiovascular;  Laterality: N/A;    Family History  Problem Relation Age of Onset  . Heart attack Mother   . CAD Mother   .  Diabetes Mother   . Hypertension Neg Hx   . Thyroid disease Other     Social History:  reports that she has never smoked. She has never used smokeless tobacco. She reports that she does not drink alcohol or use drugs.  Allergies: No Known Allergies  Review of Systems:   Occasional hot flashes    Examination:   BP 120/74 (BP Location: Left Arm, Patient Position: Sitting, Cuff Size: Normal)   Pulse 63   Temp 98.7 F (37.1 C) (Oral)   Resp 16   Ht 5\' 4"  (1.626 m)   Wt 133 lb (60.3 kg)   SpO2 99%   BMI 22.83 kg/m   She looks well      Eyes:  she has mild Bilateral exophthalmos present with mild lower lid retraction.  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

## 2016-11-19 ENCOUNTER — Other Ambulatory Visit: Payer: BC Managed Care – PPO

## 2016-11-24 ENCOUNTER — Ambulatory Visit: Payer: BC Managed Care – PPO | Admitting: Endocrinology

## 2016-12-20 ENCOUNTER — Other Ambulatory Visit (INDEPENDENT_AMBULATORY_CARE_PROVIDER_SITE_OTHER): Payer: BLUE CROSS/BLUE SHIELD

## 2016-12-20 DIAGNOSIS — E890 Postprocedural hypothyroidism: Secondary | ICD-10-CM | POA: Diagnosis not present

## 2016-12-20 LAB — T4, FREE: FREE T4: 0.61 ng/dL (ref 0.60–1.60)

## 2016-12-20 LAB — TSH: TSH: 7.92 u[IU]/mL — AB (ref 0.35–4.50)

## 2016-12-23 ENCOUNTER — Ambulatory Visit (INDEPENDENT_AMBULATORY_CARE_PROVIDER_SITE_OTHER): Payer: BLUE CROSS/BLUE SHIELD | Admitting: Endocrinology

## 2016-12-23 ENCOUNTER — Encounter: Payer: Self-pay | Admitting: Endocrinology

## 2016-12-23 VITALS — BP 118/68 | HR 71 | Ht 64.0 in | Wt 136.0 lb

## 2016-12-23 DIAGNOSIS — E890 Postprocedural hypothyroidism: Secondary | ICD-10-CM

## 2016-12-23 MED ORDER — LEVOTHYROXINE SODIUM 50 MCG PO TABS: ORAL_TABLET | ORAL | 3 refills | Status: DC

## 2016-12-23 NOTE — Progress Notes (Signed)
Patient ID: Deborah MartesBarbara Banks, female   DOB: [birth year], 61 y.o.   MRN: [id number]   Reason for Appointment:  followup of hypothyroidism  History of Present Illness:   Background information: She previously had hyperthyroidism,  first diagnosed in 09/2013 presenting with symptoms of palpitations, shortness of breath  Because of her need for persistent antithyroid drugs she was given 15 mCi I-131 treatment on 04/11/14    HISTORY: Her free T4 was low in 10/15 although at that time she was subjectively feeling fairly good except for cold intolerance  In anticipation of hypothyroidism she was started on 75 g of levothyroxine  However in 11/15 although she was not having any hyperthyroid symptoms she was having more problems with her eyes being more prominent and also dryness. Because of high normal free T4 and suppressed TSH her dose was reduced to 50 g  This was further reduced to 37.5 g in 9/16 and at that time she was feeling fairly good overall In 6/17 she had run out of her levothyroxine medication for 6 weeks, was taking 37.5 g Since her TSH was normal at 1.1 she was told to hold off on restarting  Recent history: Her TSH started going up in 11/17 without any supplement although she was not complaining of any unusual fatigue She was complaining of cold intolerance  She was started on 25 g of levothyroxine but she says she does not feel any different with this Still complaining of feeling excessively cold, weight is up 3 pounds  She does complain of mild dry eyes, unchanged  Her TSH is now further increased to 7.9 Free T4 is low normal  Weight history:  Wt Readings from Last 3 Encounters:  12/23/16 136 lb (61.7 kg)  08/24/16 133 lb (60.3 kg)  03/19/16 138 lb (62.6 kg)     Lab Results  Component Value Date   TSH 7.92 (H) 12/20/2016   TSH 5.87 (H) 08/20/2016   TSH 1.09 03/16/2016   FREET4 0.61 12/20/2016   FREET4 0.56 (L) 08/20/2016   FREET4  0.68 03/16/2016         Lab Results  Component Value Date   T3FREE 2.4 03/16/2016   T3FREE 3.3 06/23/2015      Allergies as of 12/23/2016   No Known Allergies     Medication List       Accurate as of 12/23/16  3:53 PM. Always use your most recent med list.          levothyroxine 25 MCG tablet Commonly known as:  SYNTHROID, LEVOTHROID 1 tablet daily           Past Medical History:  Diagnosis Date  . Abnormal CT scan, chest    a. mild mediastinal lymphadenopathy - instructed to f/u PCP.  . Diastolic CHF (HCC)    a. During 09/2013 adm for CP - normal cors, no PE. CT angio with LVH.  Marland Kitchen. Elevated alkaline phosphatase level    a. During 09/2013 adm.  Marland Kitchen. GERD (gastroesophageal reflux disease)   . Hypokalemia   . Microscopic hematuria    a. During 09/2013 adm.  Marland Kitchen. PAF (paroxysmal atrial fibrillation) (HCC)    a. During 09/2013 adm for CP - normal cors, no PE.  Marland Kitchen. Paroxysmal atrial flutter (HCC)    a. During 09/2013 adm for CP - normal cors, no PE.    Past Surgical History:  Procedure Laterality Date  . ABDOMINAL HYSTERECTOMY    .  LEFT HEART CATHETERIZATION WITH CORONARY ANGIOGRAM N/A 09/03/2013   Procedure: LEFT HEART CATHETERIZATION WITH CORONARY ANGIOGRAM;  Surgeon: Corky Crafts, MD;  Location: [facility] CATH LAB;  Service: Cardiovascular;  Laterality: N/A;    Family History  Problem Relation Age of Onset  . Heart attack Mother   . CAD Mother   . Diabetes Mother   . Hypertension Neg Hx   . Thyroid disease Other     Social History:  reports that she has never smoked. She has never used smokeless tobacco. She reports that she does not drink alcohol or use drugs.  Allergies: No Known Allergies  Review of Systems:  No recent visual difficulties   Examination:   BP 118/68   Pulse 71   Ht 5\' 4"  (1.626 m)   Wt 136 lb (61.7 kg)   SpO2 96%   BMI 23.34 kg/m    Eyes:  she has mild Bilateral exophthalmos present with mild lower lid retraction.  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 Although she was euthyroid with taking no medication in 2017 she now appears to have mild progressive hypothyroidism again She is however not significantly symptomatic, not clear if her cold intolerances related to hypothyroidism  Currently with 25 g of levothyroxine and her TSH is increasing further and will need to increase her dose to 50 g  She is not very symptomatic so will start her only on 25 g levothyroxine  Graves ophthalmopathy: Mild with some proptosis and not symptomatic  Follow-up in 2 months again    [facility] 12/23/2016, 3:53 PM

## 2017-02-18 ENCOUNTER — Other Ambulatory Visit: Payer: BLUE CROSS/BLUE SHIELD

## 2017-02-18 DIAGNOSIS — E890 Postprocedural hypothyroidism: Secondary | ICD-10-CM

## 2017-02-19 LAB — TSH: TSH: 13.15 u[IU]/mL — AB (ref 0.450–4.500)

## 2017-02-19 LAB — T4, FREE: Free T4: 0.83 ng/dL (ref 0.82–1.77)

## 2017-02-22 ENCOUNTER — Ambulatory Visit (INDEPENDENT_AMBULATORY_CARE_PROVIDER_SITE_OTHER): Payer: BLUE CROSS/BLUE SHIELD | Admitting: Endocrinology

## 2017-02-22 ENCOUNTER — Encounter: Payer: Self-pay | Admitting: Endocrinology

## 2017-02-22 VITALS — BP 111/64 | HR 62 | Ht 64.0 in | Wt 136.6 lb

## 2017-02-22 DIAGNOSIS — E890 Postprocedural hypothyroidism: Secondary | ICD-10-CM | POA: Diagnosis not present

## 2017-02-22 MED ORDER — LEVOTHYROXINE SODIUM 75 MCG PO TABS: 75.0000 ug | ORAL_TABLET | Freq: Every day | ORAL | 3 refills | Status: DC

## 2017-02-22 NOTE — Progress Notes (Signed)
Patient ID: Deborah Banks, female   DOB: 11-04-1955, 61 y.o.   MRN: [id number]   Reason for Appointment:  followup of hypothyroidism  History of Present Illness:     Background information: She previously had hyperthyroidism,  first diagnosed in 09/2013 presenting with symptoms of palpitations, shortness of breath  Because of her need for persistent antithyroid drugs she was given 15 mCi I-131 treatment on 04/11/14    HISTORY: Her free T4 was low in 10/15 although at that time she was subjectively feeling fairly good except for cold intolerance She was started on 75 g of levothyroxine  However in 11/15 although she was not having any hyperthyroid symptoms she was having more problems with her eyes being more prominent and also dryness. Because of high normal free T4 and suppressed TSH her dose was reduced to 50 g  This was further reduced to 37.5 g in 9/16 and at that time she was feeling fairly good overall In 6/17 she had run out of her levothyroxine medication for 6 weeks, was taking 37.5 g Since her TSH was normal at 1.1 she was told to hold off on restarting  Recent history: Her TSH started going up in 11/17 without the levothyroxine supplement although she was not complaining of any unusual fatigue She was complaining of cold intolerance  She was started on 25 g of levothyroxine and this was further increased in 3/18 when TSH was high She thinks she felt less tired with increasing the dose  Currently she feels fairly good with her energy level No cold intolerance or weight change She thinks she is taking her thyroxine consistently in the morning before breakfast She has not taken any iron or calcium supplements at the same time  Her TSH is now further increased to 13.1, previously 7.9 Free T4 is  normal  Weight history:  Wt Readings from Last 3 Encounters:  02/22/17 136 lb 9.6 oz (62 kg)  12/23/16 136 lb (61.7 kg)  08/24/16 133 lb (60.3  kg)     Lab Results  Component Value Date   TSH 13.150 (H) 02/18/2017   TSH 7.92 (H) 12/20/2016   TSH 5.87 (H) 08/20/2016   FREET4 0.83 02/18/2017   FREET4 0.61 12/20/2016   FREET4 0.56 (L) 08/20/2016         Lab Results  Component Value Date   T3FREE 2.4 03/16/2016   T3FREE 3.3 06/23/2015      Allergies as of 02/22/2017   No Known Allergies     Medication List       Accurate as of 02/22/17  4:41 PM. Always use your most recent med list.          levothyroxine 50 MCG tablet Commonly known as:  SYNTHROID, LEVOTHROID 1 tablet daily           Past Medical History:  Diagnosis Date  . Abnormal CT scan, chest    a. mild mediastinal lymphadenopathy - instructed to f/u PCP.  . Diastolic CHF (HCC)    a. During 09/2013 adm for CP - normal cors, no PE. CT angio with LVH.  Marland Kitchen Elevated alkaline phosphatase level    a. During 09/2013 adm.  Marland Kitchen GERD (gastroesophageal reflux disease)   . Hypokalemia   . Microscopic hematuria    a. During 09/2013 adm.  Marland Kitchen PAF (paroxysmal atrial fibrillation) (HCC)    a. During 09/2013 adm for CP - normal cors, no PE.  Marland Kitchen  Paroxysmal atrial flutter (HCC)    a. During 09/2013 adm for CP - normal cors, no PE.    Past Surgical History:  Procedure Laterality Date  . ABDOMINAL HYSTERECTOMY    . LEFT HEART CATHETERIZATION WITH CORONARY ANGIOGRAM N/A 09/03/2013   Procedure: LEFT HEART CATHETERIZATION WITH CORONARY ANGIOGRAM;  Surgeon: Corky CraftsJayadeep S Varanasi, MD;  [facility];  Service: Cardiovascular;  Laterality: N/A;    Family History  Problem Relation Age of Onset  . Heart attack Mother   . CAD Mother   . Diabetes Mother   . Hypertension Neg Hx   . Thyroid disease Other     Social History:  reports that she has never smoked. She has never used smokeless tobacco. She reports that she does not drink alcohol or use drugs.  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 Although she was euthyroid with taking no medication in 2017 she now appears to have mild progressive hypothyroidism again Currently she is feeling fairly good although did think she had better energy level with increasing her dose up to 50 g  She now appears to have progressive hypothyroidism with TSH around 13 even with increasing the dose in March She is however not symptomatic  She will now go up to 75 g daily  Follow-up in 2 months again    [facility]CKUMAR,[name] 02/22/2017, 4:41 PM

## 2017-02-22 NOTE — Patient Instructions (Signed)
Take 1 1/2 daily

## 2017-04-25 ENCOUNTER — Other Ambulatory Visit (INDEPENDENT_AMBULATORY_CARE_PROVIDER_SITE_OTHER): Payer: BLUE CROSS/BLUE SHIELD

## 2017-04-25 DIAGNOSIS — E890 Postprocedural hypothyroidism: Secondary | ICD-10-CM | POA: Diagnosis not present

## 2017-04-25 LAB — T4, FREE: FREE T4: 0.98 ng/dL (ref 0.60–1.60)

## 2017-04-25 LAB — TSH: TSH: 0.11 u[IU]/mL — ABNORMAL LOW (ref 0.35–4.50)

## 2017-04-28 ENCOUNTER — Encounter: Payer: Self-pay | Admitting: Endocrinology

## 2017-04-28 ENCOUNTER — Ambulatory Visit (INDEPENDENT_AMBULATORY_CARE_PROVIDER_SITE_OTHER): Payer: BLUE CROSS/BLUE SHIELD | Admitting: Endocrinology

## 2017-04-28 VITALS — BP 124/82 | HR 67 | Ht 64.0 in | Wt 135.8 lb

## 2017-04-28 DIAGNOSIS — E890 Postprocedural hypothyroidism: Secondary | ICD-10-CM

## 2017-04-28 MED ORDER — LEVOTHYROXINE SODIUM 125 MCG PO TABS: 62.5000 ug | ORAL_TABLET | Freq: Every day | ORAL | 3 refills | Status: DC

## 2017-04-28 NOTE — Patient Instructions (Signed)
2x per week take 1/2 pill

## 2017-04-28 NOTE — Progress Notes (Signed)
Patient ID: Deborah MartesBarbara Banks, female   DOB: 11-Oct-1955, 61 y.o.   MRN: [id number]   Reason for Appointment:  followup of hypothyroidism  History of Present Illness:     Background information: She previously had hyperthyroidism,  first diagnosed in 09/2013 presenting with symptoms of palpitations, shortness of breath  Because of her need for persistent antithyroid drugs she was given 15 mCi I-131 treatment on 04/11/14    HISTORY: Her free T4 was low in 10/15 although at that time she was subjectively feeling fairly good except for cold intolerance She was started on 75 g of levothyroxine  However in 11/15 although she was not having any hyperthyroid symptoms she was having more problems with her eyes being more prominent and also dryness. Because of high normal free T4 and suppressed TSH her dose was reduced to 50 g  This was further reduced to 37.5 g in 9/16 and at that time she was feeling fairly good overall In 6/17 she had run out of her levothyroxine medication for 6 weeks, was taking 37.5 g Since her TSH was normal at 1.1 she was told to hold off on restarting  Recent history: Her TSH started going up in 11/17 without the levothyroxine supplement although she was not complaining of any unusual fatigue She was complaining of cold intolerance  She was started on 25 g of levothyroxine and this was further increased up to 50 g in 3/18 when TSH was high She thinks she felt less tired with increasing the dose However her TSH was further increased at 13.1 in 5/18  She was not complaining of any fatigue or cold intolerance at this point Her levothyroxine was increased to 75 g  Currently she feels fairly good with her energy level At times she however does feel shaky or nervous and occasionally has some palpitations No heat intolerance Her weight is about the same  She  is taking her thyroxine consistently in the morning before breakfast She has not  taken any iron or calcium supplements at the same time  Her TSH is now suppressed at 0.1 although free T4 is only slightly higher  Weight history:  Wt Readings from Last 3 Encounters:  04/28/17 135 lb 12.8 oz (61.6 kg)  02/22/17 136 lb 9.6 oz (62 kg)  12/23/16 136 lb (61.7 kg)     Lab Results  Component Value Date   TSH 0.11 (L) 04/25/2017   TSH 13.150 (H) 02/18/2017   TSH 7.92 (H) 12/20/2016   FREET4 0.98 04/25/2017   FREET4 0.83 02/18/2017   FREET4 0.61 12/20/2016         Lab Results  Component Value Date   T3FREE 2.4 03/16/2016   T3FREE 3.3 06/23/2015      Allergies as of 04/28/2017   No Known Allergies     Medication List       Accurate as of 04/28/17  4:04 PM. Always use your most recent med list.          levothyroxine 75 MCG tablet Commonly known as:  SYNTHROID, LEVOTHROID Take 1 tablet (75 mcg total) by mouth daily.           Past Medical History:  Diagnosis Date  . Abnormal CT scan, chest    a. mild mediastinal lymphadenopathy - instructed to f/u PCP.  . Diastolic CHF (HCC)    a. During 09/2013 adm for CP - normal cors, no PE. CT angio with  LVH.  . Elevated alkaline phosphatase level    a. During 09/2013 adm.  Marland Kitchen. GERD (gastroesophageal reflux disease)   . Hypokalemia   . Microscopic hematuria    a. During 09/2013 adm.  Marland Kitchen. PAF (paroxysmal atrial fibrillation) (HCC)    a. During 09/2013 adm for CP - normal cors, no PE.  Marland Kitchen. Paroxysmal atrial flutter (HCC)    a. During 09/2013 adm for CP - normal cors, no PE.    Past Surgical History:  Procedure Laterality Date  . ABDOMINAL HYSTERECTOMY    . LEFT HEART CATHETERIZATION WITH CORONARY ANGIOGRAM N/A 09/03/2013   Procedure: LEFT HEART CATHETERIZATION WITH CORONARY ANGIOGRAM;  Surgeon: Corky CraftsJayadeep S Varanasi, MD;  [facility];  Service: Cardiovascular;  Laterality: N/A;    Family History  Problem Relation Age of Onset  . Heart attack Mother   . CAD Mother   . Diabetes Mother   .  Hypertension Neg Hx   . Thyroid disease Other     Social History:  reports that she has never smoked. She has never used smokeless tobacco. She reports that she does not drink alcohol or use drugs.  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 Although she was euthyroid with taking no medication in 2017 she now appears to have mild progressive hypothyroidism   She has required a progressive increase her now her thyroid supplementation but surprisingly with increasing her dose from 50 up to 75 g her TSH is now low and she has mild hyperthyroid symptoms  Most likely her hypothyroidism has plateaued and she will require 62.5 g dose instead of 75  She will use 125 g, half tablet daily but while she has her 75 g she will leave off 1 tablet per week  Follow-up in 2 months again  Patient Instructions  2x per week take 1/2 pill     Total visit time 15 minutes  Dashonna Chagnon 04/28/2017, 4:04 PM

## 2017-05-03 ENCOUNTER — Telehealth: Payer: Self-pay | Admitting: Endocrinology

## 2017-05-03 NOTE — Telephone Encounter (Signed)
Patient called in reference to FMLA papers that she was told are at the front office. Patient would like these papers to be faxed to [phone].

## 2017-05-10 NOTE — Telephone Encounter (Signed)
This has been faxed.

## 2017-06-24 ENCOUNTER — Other Ambulatory Visit: Payer: BC Managed Care – PPO

## 2017-06-29 ENCOUNTER — Ambulatory Visit: Payer: BC Managed Care – PPO | Admitting: Endocrinology

## 2017-08-01 ENCOUNTER — Other Ambulatory Visit: Payer: BLUE CROSS/BLUE SHIELD

## 2017-08-04 ENCOUNTER — Ambulatory Visit: Payer: BLUE CROSS/BLUE SHIELD | Admitting: Endocrinology

## 2017-09-06 ENCOUNTER — Other Ambulatory Visit (INDEPENDENT_AMBULATORY_CARE_PROVIDER_SITE_OTHER): Payer: BLUE CROSS/BLUE SHIELD

## 2017-09-06 DIAGNOSIS — E890 Postprocedural hypothyroidism: Secondary | ICD-10-CM

## 2017-09-06 LAB — T4, FREE: Free T4: 0.7 ng/dL (ref 0.60–1.60)

## 2017-09-06 LAB — TSH: TSH: 9.62 u[IU]/mL — ABNORMAL HIGH (ref 0.35–4.50)

## 2017-09-07 NOTE — Progress Notes (Signed)
Patient ID: Deborah MartesBarbara Rahl, female   DOB: 25-Apr-1956, 61 y.o.   MRN: [id number]   Reason for Appointment:  followup of hypothyroidism  History of Present Illness:     Background information: She previously had hyperthyroidism,  first diagnosed in 09/2013 presenting with symptoms of palpitations, shortness of breath  Because of her need for persistent antithyroid drugs she was given 15 mCi I-131 treatment on 04/11/14    HISTORY: Her free T4 was low in 10/15 although at that time she was subjectively feeling fairly good except for cold intolerance She was started on 75 g of levothyroxine  However in 11/15 although she was not having any hyperthyroid symptoms she was having more problems with her eyes being more prominent and also dryness. Because of high normal free T4 and suppressed TSH her dose was reduced to 50 g  This was further reduced to 37.5 g in 9/16 and at that time she was feeling fairly good overall In 6/17 she had run out of her levothyroxine medication for 6 weeks, was taking 37.5 g Since her TSH was normal at 1.1 she was told to hold off on restarting  Recent history: Hypothyroidism: This occurred in 08/2016 when TSH was going up without the levothyroxine supplement although she was not complaining of any unusual fatigueShe was complaining of cold intolerance  She was started on 25 g of levothyroxine and this was further increased up to 50 g in 3/18 when TSH was high However her TSH was further increased at 13.1 in 5/18 Her levothyroxine was increased to 75 g  More recently however with her TSH going down to 0.11 the dose was reduced to this 62.5 g dosage in July; however she did not come back for follow-up in September as directed Currently she feels fairly good with her energy level and she thinks she feels better in many ways However her weight has gone up 8 pounds  She does feel some cold intolerance  She  is taking her thyroxine  consistently in the morning before eating and not had missed doses She has not taken any iron or calcium supplements at the same time  Her TSH is now back up to 9.6 with normal free T4  Weight history:  Wt Readings from Last 3 Encounters:  09/08/17 143 lb 3.2 oz (65 kg)  04/28/17 135 lb 12.8 oz (61.6 kg)  02/22/17 136 lb 9.6 oz (62 kg)     Lab Results  Component Value Date   TSH 9.62 (H) 09/06/2017   TSH 0.11 (L) 04/25/2017   TSH 13.150 (H) 02/18/2017   FREET4 0.70 09/06/2017   FREET4 0.98 04/25/2017   FREET4 0.83 02/18/2017         Lab Results  Component Value Date   T3FREE 2.4 03/16/2016   T3FREE 3.3 06/23/2015      Allergies as of 09/08/2017   No Known Allergies     Medication List        Accurate as of 09/08/17  2:29 PM. Always use your most recent med list.          levothyroxine 75 MCG tablet Commonly known as:  SYNTHROID, LEVOTHROID Take 1 tablet (75 mcg total) by mouth daily.           Past Medical History:  Diagnosis Date  . Abnormal CT scan, chest    a. mild mediastinal lymphadenopathy - instructed to f/u PCP.  . Diastolic CHF (  HCC)    a. During 09/2013 adm for CP - normal cors, no PE. CT angio with LVH.  Marland Kitchen. Elevated alkaline phosphatase level    a. During 09/2013 adm.  Marland Kitchen. GERD (gastroesophageal reflux disease)   . Hypokalemia   . Microscopic hematuria    a. During 09/2013 adm.  Marland Kitchen. PAF (paroxysmal atrial fibrillation) (HCC)    a. During 09/2013 adm for CP - normal cors, no PE.  Marland Kitchen. Paroxysmal atrial flutter (HCC)    a. During 09/2013 adm for CP - normal cors, no PE.    Past Surgical History:  Procedure Laterality Date  . ABDOMINAL HYSTERECTOMY    . LEFT HEART CATHETERIZATION WITH CORONARY ANGIOGRAM N/A 09/03/2013   Procedure: LEFT HEART CATHETERIZATION WITH CORONARY ANGIOGRAM;  Surgeon: Corky CraftsJayadeep S Varanasi, MD;  [facility];  Service: Cardiovascular;  Laterality: N/A;    Family History  Problem Relation Age of Onset  . Heart  attack Mother   . CAD Mother   . Diabetes Mother   . Hypertension Neg Hx   . Thyroid disease Other     Social History:  reports that  has never smoked. she has never used smokeless tobacco. She reports that she does not drink alcohol or use drugs.  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 Although she was euthyroid with taking no medication in 2017 she subsequently has been hypothyroid consistently  Her thyroid function has been fluctuating significantly this year with her dose being increased and decreased periodically Although she has not come back for 5 months after her last doses change she appears to be getting more hypothyroid now with TSH over 9, currently taking 62.5 g daily She has been regular with taking the medication She is presently not feeling fatigued although has gained weight and is complaining of cold intolerance  Discussed that she is probably going to get significantly hypothyroid now and needs to go up to the 75 g dose again Reminded her to take her supplement regularly and avoid any calcium or iron supplements at the same time  Follow-up in 3 months  There are no Patient Instructions on file for this visit.  Total visit time 15 minutes  Bellarose Burtt 09/08/2017, 2:29 PM

## 2017-09-08 ENCOUNTER — Encounter: Payer: Self-pay | Admitting: Endocrinology

## 2017-09-08 ENCOUNTER — Ambulatory Visit (INDEPENDENT_AMBULATORY_CARE_PROVIDER_SITE_OTHER): Payer: BLUE CROSS/BLUE SHIELD | Admitting: Endocrinology

## 2017-09-08 VITALS — BP 118/80 | HR 70 | Ht 64.0 in | Wt 143.2 lb

## 2017-09-08 DIAGNOSIS — E890 Postprocedural hypothyroidism: Secondary | ICD-10-CM | POA: Diagnosis not present

## 2017-09-08 MED ORDER — LEVOTHYROXINE SODIUM 75 MCG PO TABS: 75.0000 ug | ORAL_TABLET | Freq: Every day | ORAL | 3 refills | Status: DC

## 2017-12-02 ENCOUNTER — Other Ambulatory Visit (INDEPENDENT_AMBULATORY_CARE_PROVIDER_SITE_OTHER): Payer: BLUE CROSS/BLUE SHIELD

## 2017-12-02 DIAGNOSIS — E890 Postprocedural hypothyroidism: Secondary | ICD-10-CM | POA: Diagnosis not present

## 2017-12-02 LAB — T4, FREE: FREE T4: 0.9 ng/dL (ref 0.60–1.60)

## 2017-12-02 LAB — TSH: TSH: 0.29 u[IU]/mL — ABNORMAL LOW (ref 0.35–4.50)

## 2017-12-07 ENCOUNTER — Ambulatory Visit (INDEPENDENT_AMBULATORY_CARE_PROVIDER_SITE_OTHER): Payer: BLUE CROSS/BLUE SHIELD | Admitting: Endocrinology

## 2017-12-07 ENCOUNTER — Encounter: Payer: Self-pay | Admitting: Endocrinology

## 2017-12-07 VITALS — BP 134/80 | HR 72 | Ht 64.0 in | Wt 142.4 lb

## 2017-12-07 DIAGNOSIS — E890 Postprocedural hypothyroidism: Secondary | ICD-10-CM

## 2017-12-07 NOTE — Patient Instructions (Signed)
Sundays: take 1/2 tabs

## 2017-12-07 NOTE — Progress Notes (Signed)
Patient ID: Deborah Banks, female   DOB: 1956-07-21, 62 y.o.   MRN: [id number]   Reason for Appointment:  followup of hypothyroidism  History of Present Illness:     Background information: She previously had hyperthyroidism,  first diagnosed in 09/2013 presenting with symptoms of palpitations, shortness of breath  Because of her need for persistent antithyroid drugs she was given 15 mCi I-131 treatment on 04/11/14    HISTORY: Her free T4 was low in 10/15 although at that time she was subjectively feeling fairly good except for cold intolerance She was started on 75 g of levothyroxine  However in 11/15 although she was not having any hyperthyroid symptoms she was having more problems with her eyes being more prominent and also dryness. Because of high normal free T4 and suppressed TSH her dose was reduced to 50 g  This was further reduced to 37.5 g in 9/16 and at that time she was feeling fairly good overall In 6/17 she had run out of her levothyroxine medication for 6 weeks, was taking 37.5 g Since her TSH was normal at 1.1 she was told to hold off on restarting  Recent history: Hypothyroidism: This occurred in 08/2016 when TSH was going up without the levothyroxine supplement although she was not complaining of any unusual fatigueShe was complaining of cold intolerance  She was started on 25 g of levothyroxine and this was further increased up to 50 g in 3/18 when TSH was high However her TSH was further increased at 13.1 in 5/18 Her levothyroxine was increased to 75 g  More recently however with her TSH going down to 0.11 the dose was reduced to this 62.5 g dosage in July 2018 However in 12/18 her TSH was up to 9.62 She was gaining weight but had that time did not complain of any unusual fatigue  Now she is taking 75 mcg of levothyroxine since 09/2017 She does think that she has less fatigue now and her weight is leveled off No cold  intolerance  She  is taking her supplement consistently in the morning before eating and has been regular She has not taken any iron or calcium supplements at the same time  Her TSH is now back down and at 0.29 with normal free T4  Weight history:  Wt Readings from Last 3 Encounters:  12/07/17 142 lb 6.4 oz (64.6 kg)  09/08/17 143 lb 3.2 oz (65 kg)  04/28/17 135 lb 12.8 oz (61.6 kg)     Lab Results  Component Value Date   TSH 0.29 (L) 12/02/2017   TSH 9.62 (H) 09/06/2017   TSH 0.11 (L) 04/25/2017   FREET4 0.90 12/02/2017   FREET4 0.70 09/06/2017   FREET4 0.98 04/25/2017         Lab Results  Component Value Date   T3FREE 2.4 03/16/2016   T3FREE 3.3 06/23/2015      Allergies as of 12/07/2017   No Known Allergies     Medication List        Accurate as of 12/07/17  3:53 PM. Always use your most recent med list.          levothyroxine 75 MCG tablet Commonly known as:  SYNTHROID, LEVOTHROID Take 1 tablet (75 mcg total) by mouth daily.           Past Medical History:  Diagnosis Date  . Abnormal CT scan, chest    a. mild mediastinal lymphadenopathy -  instructed to f/u PCP.  . Diastolic CHF (HCC)    a. During 09/2013 adm for CP - normal cors, no PE. CT angio with LVH.  Marland Kitchen. Elevated alkaline phosphatase level    a. During 09/2013 adm.  Marland Kitchen. GERD (gastroesophageal reflux disease)   . Hypokalemia   . Microscopic hematuria    a. During 09/2013 adm.  Marland Kitchen. PAF (paroxysmal atrial fibrillation) (HCC)    a. During 09/2013 adm for CP - normal cors, no PE.  Marland Kitchen. Paroxysmal atrial flutter (HCC)    a. During 09/2013 adm for CP - normal cors, no PE.    Past Surgical History:  Procedure Laterality Date  . ABDOMINAL HYSTERECTOMY    . LEFT HEART CATHETERIZATION WITH CORONARY ANGIOGRAM N/A 09/03/2013   Procedure: LEFT HEART CATHETERIZATION WITH CORONARY ANGIOGRAM;  Surgeon: Corky CraftsJayadeep S Varanasi, MD;  [facility];  Service: Cardiovascular;  Laterality: N/A;    Family  History  Problem Relation Age of Onset  . Heart attack Mother   . CAD Mother   . Diabetes Mother   . Hypertension Neg Hx   . Thyroid disease Other     Social History:  reports that  has never smoked. she has never used smokeless tobacco. She reports that she does not drink alcohol or use drugs.  Allergies: No Known Allergies  Review of Systems:  No recent difficulties with dryness or irritation of the eyes  Blood pressure check times 2 today  BP Readings from Last 3 Encounters:  12/07/17 134/80  09/08/17 118/80  04/28/17 124/82      Examination:   BP 134/80 (Cuff Size: Normal)   Pulse 72   Ht 5\' 4"  (1.626 m)   Wt 142 lb 6.4 oz (64.6 kg)   SpO2 96%   BMI 24.44 kg/m       Assessment/Plan:   Post-ablative hypothyroidism   She has had mild but variable post ablative hypothyroidism after her I-131 treatment in 04/2014.  Since later in 2018 she appears to have some progression of her hypothyroidism However with only a slight change in her supplement in December her TSH has gone down to 0.29 from 9.6 She does feel better with her dosage increase since December however  Since her TSH is only minimally suppressed she will continue the same prescription but take the levothyroxine 75 mcg, 6-1/2 pills/week Follow-up in 6 months  Patient Instructions  Sundays: take 1/2 tabs     Reather LittlerAjay Trayon Krantz 12/07/2017, 3:53 PM

## 2018-06-06 ENCOUNTER — Other Ambulatory Visit (INDEPENDENT_AMBULATORY_CARE_PROVIDER_SITE_OTHER): Payer: BLUE CROSS/BLUE SHIELD

## 2018-06-06 DIAGNOSIS — E890 Postprocedural hypothyroidism: Secondary | ICD-10-CM

## 2018-06-06 LAB — T4, FREE: FREE T4: 0.98 ng/dL (ref 0.60–1.60)

## 2018-06-06 LAB — TSH: TSH: 0.19 u[IU]/mL — AB (ref 0.35–4.50)

## 2018-06-08 ENCOUNTER — Ambulatory Visit (INDEPENDENT_AMBULATORY_CARE_PROVIDER_SITE_OTHER): Payer: BLUE CROSS/BLUE SHIELD | Admitting: Endocrinology

## 2018-06-08 ENCOUNTER — Encounter: Payer: Self-pay | Admitting: Endocrinology

## 2018-06-08 VITALS — BP 120/68 | HR 66 | Ht 64.0 in | Wt 141.0 lb

## 2018-06-08 DIAGNOSIS — E890 Postprocedural hypothyroidism: Secondary | ICD-10-CM | POA: Diagnosis not present

## 2018-06-08 MED ORDER — LEVOTHYROXINE SODIUM 112 MCG PO TABS: 56.0000 ug | ORAL_TABLET | Freq: Every day | ORAL | 3 refills | Status: DC

## 2018-06-08 NOTE — Progress Notes (Signed)
Patient ID: Deborah Banks, female   DOB: 1956/06/02, 62 y.o.   MRN: [id number]   Reason for Appointment:  followup of hypothyroidism  History of Present Illness:     Background information: She previously had hyperthyroidism,  first diagnosed in 09/2013 presenting with symptoms of palpitations, shortness of breath  Because of her need for persistent antithyroid drugs she was given 15 mCi I-131 treatment on 04/11/14    HISTORY: Her free T4 was low in 10/15 although at that time she was subjectively feeling fairly good except for cold intolerance She was started on 75 g of levothyroxine  However in 11/15 although she was not having any hyperthyroid symptoms she was having more problems with her eyes being more prominent and also dryness. Because of high normal free T4 and suppressed TSH her dose was reduced to 50 g  This was further reduced to 37.5 g in 9/16 and at that time she was feeling fairly good overall In 6/17 she had run out of her levothyroxine medication for 6 weeks, was taking 37.5 g Since her TSH was normal at 1.1 she was told to hold off on restarting  Recent history:  Hypothyroidism: Diagnosed in 08/2016 when TSH was going up without the levothyroxine supplement although she was not complaining of any unusual fatigueShe was complaining of cold intolerance  She was started on 25 g of levothyroxine and this was further increased up to 50 g in 3/18 when TSH was high However her TSH was further increased at 13.1 in 5/18 Her levothyroxine was increased to 75 g but subsequently decreased again  However in 12/18 her TSH was up to 9.62 She was gaining weight but had that time did not complain of any unusual fatigue  Now she is taking 75 mcg of levothyroxine, 6-1/2 pills/week since her last visit in 3/19  She does complain of some fatigue at times but no weight loss or palpitations  She  is taking her supplement consistently in the morning  before eating and has been regular She has not taken any iron or calcium supplements at the same time  Her TSH is now back down and at 0.19 with normal free T4  Weight history:  Wt Readings from Last 3 Encounters:  06/08/18 141 lb (64 kg)  12/07/17 142 lb 6.4 oz (64.6 kg)  09/08/17 143 lb 3.2 oz (65 kg)     Lab Results  Component Value Date   TSH 0.19 (L) 06/06/2018   TSH 0.29 (L) 12/02/2017   TSH 9.62 (H) 09/06/2017   FREET4 0.98 06/06/2018   FREET4 0.90 12/02/2017   FREET4 0.70 09/06/2017         Lab Results  Component Value Date   T3FREE 2.4 03/16/2016   T3FREE 3.3 06/23/2015      Allergies as of 06/08/2018   No Known Allergies     Medication List        Accurate as of 06/08/18  3:51 PM. Always use your most recent med list.          levothyroxine 75 MCG tablet Commonly known as:  SYNTHROID, LEVOTHROID Take 1 tablet (75 mcg total) by mouth daily.           Past Medical History:  Diagnosis Date  . Abnormal CT scan, chest    a. mild mediastinal lymphadenopathy - instructed to f/u PCP.  . Diastolic CHF (HCC)    a. During 09/2013  adm for CP - normal cors, no PE. CT angio with LVH.  Marland Kitchen Elevated alkaline phosphatase level    a. During 09/2013 adm.  Marland Kitchen GERD (gastroesophageal reflux disease)   . Hypokalemia   . Microscopic hematuria    a. During 09/2013 adm.  Marland Kitchen PAF (paroxysmal atrial fibrillation) (HCC)    a. During 09/2013 adm for CP - normal cors, no PE.  Marland Kitchen Paroxysmal atrial flutter (HCC)    a. During 09/2013 adm for CP - normal cors, no PE.    Past Surgical History:  Procedure Laterality Date  . ABDOMINAL HYSTERECTOMY    . LEFT HEART CATHETERIZATION WITH CORONARY ANGIOGRAM N/A 09/03/2013   Procedure: LEFT HEART CATHETERIZATION WITH CORONARY ANGIOGRAM;  Surgeon: Corky Crafts, MD;  Location: [facility] CATH LAB;  Service: Cardiovascular;  Laterality: N/A;    Family History  Problem Relation Age of Onset  . Heart attack Mother   . CAD Mother   .  Diabetes Mother   . Thyroid disease Other   . Hypertension Neg Hx     Social History:  reports that she has never smoked. She has never used smokeless tobacco. She reports that she does not drink alcohol or use drugs.  Allergies: No Known Allergies  Review of Systems:    Blood pressure history:  BP Readings from Last 3 Encounters:  06/08/18 120/68  12/07/17 134/80  09/08/17 118/80      Examination:   BP 120/68   Pulse 66   Ht 5\' 4"  (1.626 m)   Wt 141 lb (64 kg)   SpO2 97%   BMI 24.20 kg/m   Thyroid exam normal No tremor Biceps reflexes are normal   Assessment/Plan:   Post-ablative hypothyroidism   She has had mild but variable post ablative hypothyroidism after her I-131 treatment in 04/2014.  Currently taking 75 mcg levothyroxine, 6-1/2 tablets/week The dose was slightly reduced because of mild decrease in TSH but her TSH has gone down further and not clear why  She is asymptomatic  She will now be changed to a new dose of 56 mcg daily instead of the equivalent of about 69 mcg that she is taking now She will take 1/2 tablet of the 112 mcg Needs follow-up in 3 months  There are no Patient Instructions on file for this visit.    [name] 06/08/2018, 3:51 PM

## 2018-06-20 ENCOUNTER — Telehealth: Payer: Self-pay

## 2018-06-20 NOTE — Telephone Encounter (Signed)
FMLA forms have been completed by Dr. Lucianne MussKumar. Copy placed in scan basket. Mailed originals to patient as requested.

## 2018-09-05 ENCOUNTER — Other Ambulatory Visit (INDEPENDENT_AMBULATORY_CARE_PROVIDER_SITE_OTHER): Payer: BLUE CROSS/BLUE SHIELD

## 2018-09-05 DIAGNOSIS — E890 Postprocedural hypothyroidism: Secondary | ICD-10-CM

## 2018-09-05 LAB — TSH: TSH: 3.31 u[IU]/mL (ref 0.35–4.50)

## 2018-09-05 LAB — T4, FREE: FREE T4: 0.75 ng/dL (ref 0.60–1.60)

## 2018-09-06 NOTE — Progress Notes (Signed)
Patient ID: Deborah Banks, female   DOB: 1956/08/26, 62 y.o.   MRN: [id number]   Reason for Appointment:  followup of hypothyroidism  History of Present Illness:     Background information: She previously had hyperthyroidism,  first diagnosed in 09/2013 presenting with symptoms of palpitations, shortness of breath  Because of her need for persistent antithyroid drugs she was given 15 mCi I-131 treatment on 04/11/14    HISTORY: Her free T4 was low in 10/15 although at that time she was subjectively feeling fairly good except for cold intolerance She was started on 75 g of levothyroxine  However in 11/15 although she was not having any hyperthyroid symptoms she was having more problems with her eyes being more prominent and also dryness. Because of high normal free T4 and suppressed TSH her dose was reduced to 50 g  This was further reduced to 37.5 g in 9/16 and at that time she was feeling fairly good overall In 6/17 she had run out of her levothyroxine medication for 6 weeks, was taking 37.5 g Since her TSH was normal at 1.1 she was told to hold off on restarting  Recent history:  Hypothyroidism: Diagnosed in 08/2016 when TSH was going up without the levothyroxine supplement although she was not complaining of any unusual fatigueShe was complaining of cold intolerance  She was started on 25 g of levothyroxine and this was further increased up to 50 g in 3/18 when TSH was high However her TSH was further increased at 13.1 in 5/18 Her levothyroxine was increased to 75 g but subsequently decreased again  However in 12/18 her TSH was up to 9.62  Now she is taking of levothyroxine with the last dose with the reduction in 9/19  She is having mild fatigue but she thinks she is feeling a little better than her last visit No cold intolerance but has gained 5 pounds  She  is taking her supplement consistently in the morning before eating and not on  any vitamin supplements Thyroid levels are back to normal with TSH 3.3  Weight history:  Wt Readings from Last 3 Encounters:  09/07/18 146 lb 6.4 oz (66.4 kg)  06/08/18 141 lb (64 kg)  12/07/17 142 lb 6.4 oz (64.6 kg)     Lab Results  Component Value Date   TSH 3.31 09/05/2018   TSH 0.19 (L) 06/06/2018   TSH 0.29 (L) 12/02/2017   FREET4 0.75 09/05/2018   FREET4 0.98 06/06/2018   FREET4 0.90 12/02/2017         Lab Results  Component Value Date   T3FREE 2.4 03/16/2016   T3FREE 3.3 06/23/2015      Allergies as of 09/07/2018   No Known Allergies     Medication List        Accurate as of 09/07/18  3:51 PM. Always use your most recent med list.          levothyroxine 112 MCG tablet Commonly known as:  SYNTHROID, LEVOTHROID Take 0.5 tablets (56 mcg total) by mouth daily before breakfast.           Past Medical History:  Diagnosis Date  . Abnormal CT scan, chest    a. mild mediastinal lymphadenopathy - instructed to f/u PCP.  . Diastolic CHF (HCC)    a. During 09/2013 adm for CP - normal cors, no PE. CT angio with LVH.  Marland Kitchen Elevated alkaline phosphatase level  a. During 09/2013 adm.  Marland Kitchen. GERD (gastroesophageal reflux disease)   . Hypokalemia   . Microscopic hematuria    a. During 09/2013 adm.  Marland Kitchen. PAF (paroxysmal atrial fibrillation) (HCC)    a. During 09/2013 adm for CP - normal cors, no PE.  Marland Kitchen. Paroxysmal atrial flutter (HCC)    a. During 09/2013 adm for CP - normal cors, no PE.    Past Surgical History:  Procedure Laterality Date  . ABDOMINAL HYSTERECTOMY    . LEFT HEART CATHETERIZATION WITH CORONARY ANGIOGRAM N/A 09/03/2013   Procedure: LEFT HEART CATHETERIZATION WITH CORONARY ANGIOGRAM;  Surgeon: Corky CraftsJayadeep S Varanasi, MD;  [facility];  Service: Cardiovascular;  Laterality: N/A;    Family History  Problem Relation Age of Onset  . Heart attack Mother   . CAD Mother   . Diabetes Mother   . Thyroid disease Other   . Hypertension Neg Hx      Social History:  reports that she has never smoked. She has never used smokeless tobacco. She reports that she does not drink alcohol or use drugs.  Allergies: No Known Allergies  Review of Systems:    Blood pressure history: Not on any medications  BP Readings from Last 3 Encounters:  09/07/18 (!) 142/70  06/08/18 120/68  12/07/17 134/80      Examination:   BP (!) 142/70 (BP Location: Left Arm, Patient Position: Sitting, Cuff Size: Normal)   Pulse 72   Ht 5\' 4"  (1.626 m)   Wt 146 lb 6.4 oz (66.4 kg)   SpO2 98%   BMI 25.13 kg/m   Exam not indicated   Assessment/Plan:   Post-ablative hypothyroidism   She has had mild  post ablative hypothyroidism after her I-131 treatment in 04/2014.  She has had variable requirement for levothyroxine and now taking a relatively low dose of 56 mcg daily She is using the 112 mcg prescription and using a pill cutter to cut it in half She is regular with this and her TSH is now normal  Subjectively doing well also  She will follow-up in 6 months unless she has any new symptoms   There are no Patient Instructions on file for this visit.    Reather LittlerAjay Mitchell Epling 09/07/2018, 3:51 PM

## 2018-09-07 ENCOUNTER — Ambulatory Visit (INDEPENDENT_AMBULATORY_CARE_PROVIDER_SITE_OTHER): Payer: BLUE CROSS/BLUE SHIELD | Admitting: Endocrinology

## 2018-09-07 ENCOUNTER — Encounter: Payer: Self-pay | Admitting: Endocrinology

## 2018-09-07 VITALS — BP 142/70 | HR 72 | Ht 64.0 in | Wt 146.4 lb

## 2018-09-07 DIAGNOSIS — E890 Postprocedural hypothyroidism: Secondary | ICD-10-CM

## 2018-12-06 ENCOUNTER — Other Ambulatory Visit: Payer: Self-pay | Admitting: Endocrinology

## 2019-03-12 ENCOUNTER — Telehealth: Payer: Self-pay | Admitting: Endocrinology

## 2019-03-12 NOTE — Telephone Encounter (Signed)
Per [facility] patient called and would like to reschedule her 03/13/2019 and 03/15/2019 appointments until late June. Called patient unable to reach, LMTCB so we can move appointments for her

## 2019-03-13 ENCOUNTER — Other Ambulatory Visit: Payer: BLUE CROSS/BLUE SHIELD

## 2019-03-15 ENCOUNTER — Ambulatory Visit: Payer: BLUE CROSS/BLUE SHIELD | Admitting: Endocrinology

## 2019-03-30 ENCOUNTER — Other Ambulatory Visit: Payer: Self-pay

## 2019-03-30 ENCOUNTER — Other Ambulatory Visit (INDEPENDENT_AMBULATORY_CARE_PROVIDER_SITE_OTHER): Payer: BC Managed Care – PPO

## 2019-03-30 DIAGNOSIS — E890 Postprocedural hypothyroidism: Secondary | ICD-10-CM | POA: Diagnosis not present

## 2019-03-30 LAB — TSH: TSH: 9.53 u[IU]/mL — ABNORMAL HIGH (ref 0.35–4.50)

## 2019-03-30 LAB — T4, FREE: Free T4: 0.75 ng/dL (ref 0.60–1.60)

## 2019-04-03 ENCOUNTER — Ambulatory Visit (INDEPENDENT_AMBULATORY_CARE_PROVIDER_SITE_OTHER): Payer: BC Managed Care – PPO | Admitting: Endocrinology

## 2019-04-03 ENCOUNTER — Other Ambulatory Visit: Payer: Self-pay

## 2019-04-03 DIAGNOSIS — E890 Postprocedural hypothyroidism: Secondary | ICD-10-CM | POA: Diagnosis not present

## 2019-04-03 MED ORDER — LEVOTHYROXINE SODIUM 112 MCG PO TABS: ORAL_TABLET | ORAL | 3 refills | Status: DC

## 2019-04-03 NOTE — Progress Notes (Signed)
Patient ID: Deborah Banks, female   DOB: [birth year], 63 y.o.   MRN: [id number]   Reason for Appointment:  followup of hypothyroidism  History of Present Illness:   Today's office visit was provided via telemedicine using a telephone call to the patient Patient has been explained the limitations of evaluation and management by telemedicine and the availability of in person appointments.  The patient understood the limitations and agreed to proceed. Patient also understood that the telehealth visit is billable. . Location of the patient: Home . Location of the provider: Office Only the patient and myself were participating in the encounter   Background information: She previously had hyperthyroidism,  first diagnosed in 09/2013 presenting with symptoms of palpitations, shortness of breath  Because of her need for persistent antithyroid drugs she was given 15 mCi I-131 treatment on 04/11/14    HISTORY: Her free T4 was low in 10/15 although at that time she was subjectively feeling fairly good except for cold intolerance She was started on 75 g of levothyroxine  However in 11/15 although she was not having any hyperthyroid symptoms she was having more problems with her eyes being more prominent and also dryness. Because of high normal free T4 and suppressed TSH her dose was reduced to 50 g  This was further reduced to 37.5 g in 9/16 and at that time she was feeling fairly good overall In 6/17 she had run out of her levothyroxine medication for 6 weeks, was taking 37.5 g Since her TSH was normal at 1.1 she was told to hold off on restarting  Recent history:  Hypothyroidism: Diagnosed in 08/2016 when TSH was going up without the levothyroxine supplement although she was not complaining of any unusual fatigueShe was complaining of cold intolerance  She was started on 25 g of levothyroxine and this was further increased up to 50 g in 3/18 when TSH was high  However her TSH was further increased at 13.1 in 5/18 Her levothyroxine was increased to 75 g but subsequently decreased again  However in 12/18 her TSH was up to 9.62  Now she is taking 29mcg of levothyroxine with the last dose with the reduction in 9/19  She has been regular with her levothyroxine until about 10 days ago when she ran out of her prescription and did not get a refill Without her medication she was feeling more sluggish She is back on her dosage for the last 3 to 4 days and is feeling better However she thinks she has gained 10 pounds and this is partly from not watching her diet or exercising  She  is taking her supplement consistently in the morning before eating with water  Her TSH which was 3.3 is now 9.5   Weight history:  Wt Readings from Last 3 Encounters:  09/07/18 146 lb 6.4 oz (66.4 kg)  06/08/18 141 lb (64 kg)  12/07/17 142 lb 6.4 oz (64.6 kg)     Lab Results  Component Value Date   TSH 9.53 (H) 03/30/2019   TSH 3.31 09/05/2018   TSH 0.19 (L) 06/06/2018   FREET4 0.75 03/30/2019   FREET4 0.75 09/05/2018   FREET4 0.98 06/06/2018         Lab Results  Component Value Date   T3FREE 2.4 03/16/2016   T3FREE 3.3 06/23/2015      Allergies as of 04/03/2019   No Known Allergies     Medication List  Accurate as of April 03, 2019  2:49 PM. If you have any questions, ask your nurse or doctor.        levothyroxine 112 MCG tablet Commonly known as: SYNTHROID TAKE ONE-HALF TABLET BY MOUTH DAILY BEFORE BREAKFAST           Past Medical History:  Diagnosis Date  . Abnormal CT scan, chest    a. mild mediastinal lymphadenopathy - instructed to f/u PCP.  . Diastolic CHF (HCC)    a. During 09/2013 adm for CP - normal cors, no PE. CT angio with LVH.  Marland Kitchen. Elevated alkaline phosphatase level    a. During 09/2013 adm.  Marland Kitchen. GERD (gastroesophageal reflux disease)   . Hypokalemia   . Microscopic hematuria    a. During 09/2013 adm.  Marland Kitchen. PAF  (paroxysmal atrial fibrillation) (HCC)    a. During 09/2013 adm for CP - normal cors, no PE.  Marland Kitchen. Paroxysmal atrial flutter (HCC)    a. During 09/2013 adm for CP - normal cors, no PE.    Past Surgical History:  Procedure Laterality Date  . ABDOMINAL HYSTERECTOMY    . LEFT HEART CATHETERIZATION WITH CORONARY ANGIOGRAM N/A 09/03/2013   Procedure: LEFT HEART CATHETERIZATION WITH CORONARY ANGIOGRAM;  Surgeon: Corky CraftsJayadeep S Varanasi, MD;  [facility];  Service: Cardiovascular;  Laterality: N/A;    Family History  Problem Relation Age of Onset  . Heart attack Mother   . CAD Mother   . Diabetes Mother   . Thyroid disease Other   . Hypertension Neg Hx     Social History:  reports that she has never smoked. She has never used smokeless tobacco. She reports that she does not drink alcohol or use drugs.  Allergies: No Known Allergies  Review of Systems:    Blood pressure history: Not on any medications  BP Readings from Last 3 Encounters:  09/07/18 (!) 142/70  06/08/18 120/68  12/07/17 134/80      Examination:   There were no vitals taken for this visit.     Assessment/Plan:   Post-ablative hypothyroidism   She has had mild  post ablative hypothyroidism after her I-131 treatment in 04/2014.  She has had variable requirement for levothyroxine in the past Although her TSH is over 9 this may be possibly from her not taking her levothyroxine for the week prior to her labs Discussed that it would be difficult to adjust her medication without her having taken her levothyroxine consistently Also she was not symptomatic prior to her running out of her medication  She will come back for labs in about a month and we can decide on her dosage and follow-up at that time Reminded her to be consistent with taking her levothyroxine daily before breakfast   There are no Patient Instructions on file for this visit.    Reather LittlerAjay Myrtis Maille 04/03/2019, 2:49 PM

## 2019-05-03 ENCOUNTER — Other Ambulatory Visit (INDEPENDENT_AMBULATORY_CARE_PROVIDER_SITE_OTHER): Payer: BC Managed Care – PPO

## 2019-05-03 ENCOUNTER — Other Ambulatory Visit: Payer: Self-pay

## 2019-05-03 DIAGNOSIS — E890 Postprocedural hypothyroidism: Secondary | ICD-10-CM | POA: Diagnosis not present

## 2019-05-03 LAB — TSH: TSH: 4.33 u[IU]/mL (ref 0.35–4.50)

## 2019-05-10 ENCOUNTER — Telehealth: Payer: Self-pay | Admitting: Endocrinology

## 2019-05-10 NOTE — Telephone Encounter (Signed)
"  Per Dr.Kumar, Follow-up needed for December with labs, thanks"

## 2019-05-11 NOTE — Telephone Encounter (Signed)
Completed.

## 2019-08-27 ENCOUNTER — Other Ambulatory Visit: Payer: Self-pay | Admitting: Endocrinology

## 2019-09-07 ENCOUNTER — Other Ambulatory Visit: Payer: Self-pay

## 2019-09-07 ENCOUNTER — Other Ambulatory Visit (INDEPENDENT_AMBULATORY_CARE_PROVIDER_SITE_OTHER): Payer: Self-pay

## 2019-09-07 ENCOUNTER — Other Ambulatory Visit: Payer: Self-pay | Admitting: Endocrinology

## 2019-09-07 DIAGNOSIS — E890 Postprocedural hypothyroidism: Secondary | ICD-10-CM

## 2019-09-07 NOTE — Addendum Note (Signed)
Addended by: Kaylyn Lim I on: 09/07/2019 03:33 PM   Modules accepted: Orders

## 2019-09-07 NOTE — Addendum Note (Signed)
Addended by: STONE-ELMORE, Iretha Kirley I on: 09/07/2019 03:33 PM   Modules accepted: Orders  

## 2019-09-08 LAB — TSH: TSH: 6.11 u[IU]/mL — ABNORMAL HIGH (ref 0.450–4.500)

## 2019-09-08 LAB — T4, FREE: Free T4: 1.04 ng/dL (ref 0.82–1.77)

## 2019-09-11 ENCOUNTER — Ambulatory Visit: Payer: BC Managed Care – PPO | Admitting: Endocrinology

## 2019-09-17 ENCOUNTER — Ambulatory Visit (INDEPENDENT_AMBULATORY_CARE_PROVIDER_SITE_OTHER): Payer: Self-pay | Admitting: Endocrinology

## 2019-09-17 ENCOUNTER — Other Ambulatory Visit: Payer: Self-pay

## 2019-09-17 ENCOUNTER — Encounter: Payer: Self-pay | Admitting: Endocrinology

## 2019-09-17 DIAGNOSIS — E890 Postprocedural hypothyroidism: Secondary | ICD-10-CM

## 2019-09-17 MED ORDER — LEVOTHYROXINE SODIUM 137 MCG PO TABS: 68.5000 ug | ORAL_TABLET | Freq: Every day | ORAL | 3 refills | Status: DC

## 2019-09-17 NOTE — Progress Notes (Signed)
Patient ID: Deborah Banks, female   DOB: Nov 15, 1955, 63 y.o.   MRN: [id number]   Reason for Appointment:  followup of hypothyroidism  History of Present Illness:   Today's office visit was provided via telemedicine using a telephone call to the patient Patient has been explained the limitations of evaluation and management by telemedicine and the availability of in person appointments.  The patient understood the limitations and agreed to proceed. Patient also understood that the telehealth visit is billable. . Location of the patient: Home . Location of the provider: Office Only the patient and myself were participating in the encounter   Background information: She previously had hyperthyroidism,  first diagnosed in 09/2013 presenting with symptoms of palpitations, shortness of breath  Because of her need for persistent antithyroid drugs she was given 15 mCi I-131 treatment on 04/11/14    HISTORY: Her free T4 was low in 10/15 although at that time she was subjectively feeling fairly good except for cold intolerance She was started on 75 g of levothyroxine  However in 11/15 although she was not having any hyperthyroid symptoms she was having more problems with her eyes being more prominent and also dryness. Because of high normal free T4 and suppressed TSH her dose was reduced to 50 g  This was further reduced to 37.5 g in 9/16 and at that time she was feeling fairly good overall In 6/17 she had run out of her levothyroxine medication for 6 weeks, was taking 37.5 g Since her TSH was normal at 1.1 she was told to hold off on restarting  Recent history:  Hypothyroidism: Diagnosed in 08/2016 when TSH was going up without the levothyroxine supplement although she was not complaining of any unusual fatigue, she was complaining of cold intolerance  She was started on 25 g of levothyroxine and this was further increased up to 50 g in 3/18 when TSH was  high However her TSH was further increased at 13.1 in 5/18 Her levothyroxine was increased to 75 g but subsequently decreased again  Now she is taking of levothyroxine since 9/19  She has been regular with her levothyroxine recently, on her last visit she had been out of her medication 10 days prior to the labs and her TSH was 9.5 She does not think she is any more tired than usual or having any fatigue, lethargy, cold intolerance She is gradually gaining weight and may have gained 15 pounds this year   She  is taking her supplement consistently in the morning before breakfast with water Not on any supplements with iron or calcium  Her TSH which was 1.3 is now back up to 6.1, normal free T4   Weight history:  Wt Readings from Last 3 Encounters:  09/07/18 146 lb 6.4 oz (66.4 kg)  06/08/18 141 lb (64 kg)  12/07/17 142 lb 6.4 oz (64.6 kg)     Lab Results  Component Value Date   TSH 6.110 (H) 09/07/2019   TSH 4.33 05/03/2019   TSH 9.53 (H) 03/30/2019   FREET4 1.04 09/07/2019   FREET4 0.75 03/30/2019   FREET4 0.75 09/05/2018         Lab Results  Component Value Date   T3FREE 2.4 03/16/2016   T3FREE 3.3 06/23/2015      Allergies as of 09/17/2019   No Known Allergies     Medication List       Accurate as of  September 17, 2019  8:11 AM. If you have any questions, ask your nurse or doctor.        levothyroxine 112 MCG tablet Commonly known as: SYNTHROID TAKE 1/2 TABLET BY MOUTH DAILY BEFORE BREAKFAST           Past Medical History:  Diagnosis Date  . Abnormal CT scan, chest    a. mild mediastinal lymphadenopathy - instructed to f/u PCP.  . Diastolic CHF ([place])    a. During 09/2013 adm for CP - normal cors, no PE. CT angio with LVH.  Marland Kitchen Elevated alkaline phosphatase level    a. During 09/2013 adm.  Marland Kitchen GERD (gastroesophageal reflux disease)   . Hypokalemia   . Microscopic hematuria    a. During 09/2013 adm.  Marland Kitchen PAF (paroxysmal atrial fibrillation)  ([place])    a. During 09/2013 adm for CP - normal cors, no PE.  Marland Kitchen Paroxysmal atrial flutter ([place])    a. During 09/2013 adm for CP - normal cors, no PE.    Past Surgical History:  Procedure Laterality Date  . ABDOMINAL HYSTERECTOMY    . LEFT HEART CATHETERIZATION WITH CORONARY ANGIOGRAM N/A 09/03/2013   Procedure: LEFT HEART CATHETERIZATION WITH CORONARY ANGIOGRAM;  Surgeon: Jettie Booze, MD;  Location: [facility] CATH LAB;  Service: Cardiovascular;  Laterality: N/A;    Family History  Problem Relation Age of Onset  . Heart attack Mother   . CAD Mother   . Diabetes Mother   . Thyroid disease Other   . Hypertension Neg Hx     Social History:  reports that she has never smoked. She has never used smokeless tobacco. She reports that she does not drink alcohol or use drugs.  Allergies: No Known Allergies  Review of Systems:    Blood pressure history: Not on any medications  BP Readings from Last 3 Encounters:  09/07/18 (!) 142/70  06/08/18 120/68  12/07/17 134/80      Examination:   There were no vitals taken for this visit.     Assessment/Plan:   Post-ablative hypothyroidism   She has had mild  post ablative hypothyroidism after her I-131 treatment in 04/2014.  She has had variable requirement for levothyroxine although no change made for over a year now She is not complaining of any fatigue However she is mildly hypothyroid with TSH 6.1  This is despite good compliance with her levothyroxine daily lately  She will now take 137 mcg, half tablet daily and follow-up in 3 months   There are no Patient Instructions on file for this visit.  Duration of phone call =5 minutes  Elayne Snare 09/17/2019, 8:11 AM

## 2019-10-03 ENCOUNTER — Ambulatory Visit: Payer: Self-pay | Admitting: Endocrinology

## 2019-12-13 ENCOUNTER — Other Ambulatory Visit: Payer: Self-pay

## 2019-12-20 ENCOUNTER — Ambulatory Visit: Payer: Self-pay | Admitting: Endocrinology

## 2020-01-11 ENCOUNTER — Other Ambulatory Visit (INDEPENDENT_AMBULATORY_CARE_PROVIDER_SITE_OTHER): Payer: Medicare Other

## 2020-01-11 ENCOUNTER — Other Ambulatory Visit: Payer: Self-pay

## 2020-01-11 DIAGNOSIS — E890 Postprocedural hypothyroidism: Secondary | ICD-10-CM | POA: Diagnosis not present

## 2020-01-11 LAB — T4, FREE: Free T4: 0.81 ng/dL (ref 0.60–1.60)

## 2020-01-11 LAB — TSH: TSH: 4.76 u[IU]/mL — ABNORMAL HIGH (ref 0.35–4.50)

## 2020-01-14 ENCOUNTER — Other Ambulatory Visit: Payer: Self-pay

## 2020-01-14 ENCOUNTER — Telehealth (INDEPENDENT_AMBULATORY_CARE_PROVIDER_SITE_OTHER): Payer: Medicare Other | Admitting: Endocrinology

## 2020-01-14 ENCOUNTER — Encounter: Payer: Self-pay | Admitting: Endocrinology

## 2020-01-14 DIAGNOSIS — E890 Postprocedural hypothyroidism: Secondary | ICD-10-CM | POA: Diagnosis not present

## 2020-01-14 MED ORDER — LEVOTHYROXINE SODIUM 137 MCG PO TABS: 68.5000 ug | ORAL_TABLET | Freq: Every day | ORAL | 1 refills | Status: DC

## 2020-01-14 NOTE — Progress Notes (Signed)
Patient ID: Deborah Banks, female   DOB: [birth year], 64 y.o.   MRN: [id number]   Reason for Appointment:  followup of hypothyroidism  History of Present Illness:   I connected with the above-named patient by video enabled telemedicine application and verified that I am speaking with the correct person. The patient was explained the limitations of evaluation and management by telemedicine and the availability of in person appointments.  Patient also understood that there may be a patient responsible charge related to this service . Location of the patient: Patient's home . Location of the provider: Physician office Only the patient and myself were participating in the encounter The patient understood the above statements and agreed to proceed.   Background information: She previously had hyperthyroidism,  first diagnosed in 09/2013 presenting with symptoms of palpitations, shortness of breath  Because of her need for persistent antithyroid drugs she was given 15 mCi I-131 treatment on 04/11/14    HISTORY: Her free T4 was low in 10/15 although at that time she was subjectively feeling fairly good except for cold intolerance She was started on 75 g of levothyroxine  However in 11/15 although she was not having any hyperthyroid symptoms she was having more problems with her eyes being more prominent and also dryness. Because of high normal free T4 and suppressed TSH her dose was reduced to 50 g  This was further reduced to 37.5 g in 9/16 and at that time she was feeling fairly good overall In 6/17 she had run out of her levothyroxine medication for 6 weeks, was taking 37.5 g Since her TSH was normal at 1.1 she was told to hold off on restarting  Recent history:  Hypothyroidism: Diagnosed in 08/2016 when TSH was going up without the levothyroxine supplement although she was not complaining of any unusual fatigue, she was complaining of cold intolerance  She  was started on 25 g of levothyroxine and this was further increased up to 50 g in 3/18 when TSH was high However her TSH was further increased at 13.1 in 5/18 Her levothyroxine was increased to 75 g but subsequently requirements have been variable   Now she is taking gradually increasing doses of levothyroxine  She was on of levothyroxine since 9/19 but since 12/20 she has been on 68.5 mcg daily  She has been regular with her levothyroxine and has not missed any doses She does not think she felt any different with increasing her dose on the last visit Although she thinks she is gaining weight again she thinks her weight is still around 150 pounds but is not sure  Does not feel tired or has any dry skin or cold intolerance   She  is taking her supplement consistently in the morning before breakfast with water Not on calcium supplements or other prescription drugs  Her TSH which was 6.1 is now still slightly high at 4.76   Weight history:  Wt Readings from Last 3 Encounters:  09/07/18 146 lb 6.4 oz (66.4 kg)  06/08/18 141 lb (64 kg)  12/07/17 142 lb 6.4 oz (64.6 kg)     Lab Results  Component Value Date   TSH 4.76 (H) 01/11/2020   TSH 6.110 (H) 09/07/2019   TSH 4.33 05/03/2019   FREET4 0.81 01/11/2020   FREET4 1.04 09/07/2019   FREET4 0.75 03/30/2019         Lab Results  Component Value Date  T3FREE 2.4 03/16/2016   T3FREE 3.3 06/23/2015      Allergies as of 01/14/2020   No Known Allergies     Medication List       Accurate as of January 14, 2020 11:49 AM. If you have any questions, ask your nurse or doctor.        levothyroxine 137 MCG tablet Commonly known as: SYNTHROID Take 0.5 tablets (68.5 mcg total) by mouth daily before breakfast.           Past Medical History:  Diagnosis Date  . Abnormal CT scan, chest    a. mild mediastinal lymphadenopathy - instructed to f/u PCP.  . Diastolic CHF ([place])    a. During 09/2013 adm for CP - normal  cors, no PE. CT angio with LVH.  Marland Kitchen Elevated alkaline phosphatase level    a. During 09/2013 adm.  Marland Kitchen GERD (gastroesophageal reflux disease)   . Hypokalemia   . Microscopic hematuria    a. During 09/2013 adm.  Marland Kitchen PAF (paroxysmal atrial fibrillation) ([place])    a. During 09/2013 adm for CP - normal cors, no PE.  Marland Kitchen Paroxysmal atrial flutter ([place])    a. During 09/2013 adm for CP - normal cors, no PE.    Past Surgical History:  Procedure Laterality Date  . ABDOMINAL HYSTERECTOMY    . LEFT HEART CATHETERIZATION WITH CORONARY ANGIOGRAM N/A 09/03/2013   Procedure: LEFT HEART CATHETERIZATION WITH CORONARY ANGIOGRAM;  Surgeon: Jettie Booze, MD;  Location: [facility] CATH LAB;  Service: Cardiovascular;  Laterality: N/A;    Family History  Problem Relation Age of Onset  . Heart attack Mother   . CAD Mother   . Diabetes Mother   . Thyroid disease Other   . Hypertension Neg Hx     Social History:  reports that she has never smoked. She has never used smokeless tobacco. She reports that she does not drink alcohol or use drugs.  Allergies: No Known Allergies  Review of Systems:   She is currently looking for a new PCP  No history of hypertension   Examination:   There were no vitals taken for this visit.     Assessment/Plan:   Post-ablative hypothyroidism   She has had mild  post ablative hypothyroidism after her I-131 treatment in 04/2014.  She has had variable requirement for levothyroxine although recently appears to be needing gradually increasing doses Usually does not have any symptoms such as fatigue when her thyroid levels are slightly low  Since her last visit has been taking 137 mcg, half tablet daily Has been quite regular with this and takes it before breakfast every day  Since her TSH is still mildly increased she can take a full tablet on Sundays and continue half tablet on the other days Follow-up in 3 months   There are no Patient Instructions on file for this  visit.    Elayne Snare 01/14/2020, 11:49 AM

## 2020-04-16 ENCOUNTER — Encounter: Payer: Self-pay | Admitting: Endocrinology

## 2020-04-16 LAB — BASIC METABOLIC PANEL
BUN: 11 (ref 4–21)
Creatinine: 0.6 (ref <=1.1)

## 2020-04-16 LAB — COMPREHENSIVE METABOLIC PANEL: Albumin: 3.9 (ref 3.5–5.0)

## 2020-04-16 LAB — LIPID PANEL
Cholesterol: 187 (ref 0–200)
HDL: 51 (ref 35–70)
LDL Cholesterol: 121
Triglycerides: 83 (ref 40–160)

## 2020-04-16 LAB — TSH: TSH: 0.11 — AB (ref <=5.90)

## 2020-04-16 LAB — HEMOGLOBIN A1C: Hemoglobin A1C: 5.3

## 2020-04-22 ENCOUNTER — Telehealth: Payer: Self-pay | Admitting: Endocrinology

## 2020-04-22 NOTE — Telephone Encounter (Signed)
Left voicemail message for patient to call. Patient will need to contact her PCP and have labs faxed to our office prior to her visit.

## 2020-04-22 NOTE — Telephone Encounter (Signed)
There are no labs in epic

## 2020-04-22 NOTE — Telephone Encounter (Signed)
Patient called back and I informed her of Dr Remus Blake response and gave her the fax number.

## 2020-04-22 NOTE — Telephone Encounter (Signed)
Patient said she had labs done at her PCP recently and just scheduled a follow up with Dr Lucianne Muss on 05/05/20 - patient was asking if she needed to have labs done again. Please advise.

## 2020-04-22 NOTE — Telephone Encounter (Signed)
Please advise 

## 2020-05-05 ENCOUNTER — Ambulatory Visit: Payer: Medicare Other | Admitting: Endocrinology

## 2020-05-05 ENCOUNTER — Other Ambulatory Visit: Payer: Self-pay

## 2020-05-05 ENCOUNTER — Encounter: Payer: Self-pay | Admitting: Endocrinology

## 2020-05-05 VITALS — BP 132/72 | HR 58 | Ht 64.0 in | Wt 154.6 lb

## 2020-05-05 DIAGNOSIS — E890 Postprocedural hypothyroidism: Secondary | ICD-10-CM

## 2020-05-05 NOTE — Progress Notes (Signed)
Patient ID: Deborah Banks, female   DOB: Dec 13, 1955, 64 y.o.   MRN: [id number]   Reason for Appointment:  followup of hypothyroidism  History of Present Illness:     Background information:1 She previously had hyperthyroidism,  first diagnosed in 09/2013 presenting with symptoms of palpitations, shortness of breath  Because of her need for persistent antithyroid drugs she was given 15 mCi I-131 treatment on 04/11/14    HISTORY: Her free T4 was low in 10/15 although at that time she was subjectively feeling fairly good except for cold intolerance She was started on 75 g of levothyroxine  However in 11/15 although she was not having any hyperthyroid symptoms she was having more problems with her eyes being more prominent and also dryness. Because of high normal free T4 and suppressed TSH her dose was reduced to 50 g  This was further reduced to 37.5 g in 9/16 and at that time she was feeling fairly good overall In 6/17 she had run out of her levothyroxine medication for 6 weeks, was taking 37.5 g Since her TSH was normal at 1.1 she was told to hold off on restarting  Recent history:  Hypothyroidism: Diagnosed in 08/2016 when TSH was going up without the levothyroxine supplement although she was not complaining of any unusual fatigue, she was complaining of cold intolerance  She was started on 25 g of levothyroxine and this was further increased up to 50 g in 3/18 when TSH was high However her TSH was further increased at 13.1 in 5/18 Her levothyroxine was increased to 75 g but subsequently requirements have been variable   She was on of levothyroxine since 9/19 but since 12/20 she has been on 68.5 mcg daily  She has been asymptomatic previously with high TSH levels However has been gaining weight She was last seen in 01/2020  On her last visit she was told to take 137 mcg on Sundays and 1/2 tablet on the other days but she forgot and she only  takes 1/2 tablet daily She gets her prescription from [facility] and does not know if the manufacturer has changed She does not feel any shakiness, palpitations or heat intolerance  She  is taking her levothyroxine consistently in the morning before breakfast with water Not on calcium/iron supplements or other prescription drugs  Her TSH which was 4.76 is now 0.11 done at [facility]   Weight history:  Wt Readings from Last 3 Encounters:  05/05/20 154 lb 9.6 oz (70.1 kg)  09/07/18 146 lb 6.4 oz (66.4 kg)  06/08/18 141 lb (64 kg)     Lab Results  Component Value Date   TSH 0.11 (A) 04/16/2020   TSH 4.76 (H) 01/11/2020   TSH 6.110 (H) 09/07/2019   FREET4 0.81 01/11/2020   FREET4 1.04 09/07/2019   FREET4 0.75 03/30/2019         Lab Results  Component Value Date   T3FREE 2.4 03/16/2016   T3FREE 3.3 06/23/2015      Allergies as of 05/05/2020   No Known Allergies     Medication List       Accurate as of May 05, 2020 11:05 AM. If you have any questions, ask your nurse or doctor.        levothyroxine 137 MCG tablet Commonly known as: SYNTHROID Take 0.5 tablets (68.5 mcg total) by mouth daily before breakfast.  Past Medical History:  Diagnosis Date   Abnormal CT scan, chest    a. mild mediastinal lymphadenopathy - instructed to f/u PCP.   Diastolic CHF ([facility])    a. During 09/2013 adm for CP - normal cors, no PE. CT angio with LVH.   Elevated alkaline phosphatase level    a. During 09/2013 adm.   GERD (gastroesophageal reflux disease)    Hypokalemia    Microscopic hematuria    a. During 09/2013 adm.   PAF (paroxysmal atrial fibrillation) ([facility])    a. During 09/2013 adm for CP - normal cors, no PE.   Paroxysmal atrial flutter ([facility])    a. During 09/2013 adm for CP - normal cors, no PE.    Past Surgical History:  Procedure Laterality Date   ABDOMINAL HYSTERECTOMY     LEFT HEART CATHETERIZATION WITH CORONARY ANGIOGRAM N/A 09/03/2013    Procedure: LEFT HEART CATHETERIZATION WITH CORONARY ANGIOGRAM;  Surgeon: Corky Crafts, MD;  Location: [facility] CATH LAB;  Service: Cardiovascular;  Laterality: N/A;    Family History  Problem Relation Age of Onset   Heart attack Mother    CAD Mother    Diabetes Mother    Thyroid disease Other    Hypertension Neg Hx     Social History:  reports that she has never smoked. She has never used smokeless tobacco. She reports that she does not drink alcohol and does not use drugs.  Allergies: No Known Allergies  Review of Systems:   She is not on any prescription drugs except levothyroxine  No history of hypertension   Examination:   BP (!) 132/72 (BP Location: Left Arm, Patient Position: Sitting, Cuff Size: Normal)    Pulse 58    Ht 5\' 4"  (1.626 m)    Wt 154 lb 9.6 oz (70.1 kg)    SpO2 97%    BMI 26.54 kg/m      Assessment/Plan:   Post-ablative hypothyroidism   She has had mild  post ablative hypothyroidism after her I-131 treatment in 04/2014.  She has had variable requirement for levothyroxine Currently taking 137 mcg, half tablet daily  Unable to explain why her TSH which was high at about 4.6 on her last visit is now 0.11 with the same dose She has not changed the way she takes her medication  Has been quite regular with this and takes it before breakfast every day Although she is asymptomatic we will need to get her TSH back to normal We will have her take her half tablet of the 137 mcg 5 days a week only and skip the doses on Wednesdays and Sundays  Follow-up in 2 months with repeat thyroid functions   There are no Patient Instructions on file for this visit.    01-16-1976 05/05/2020, 11:05 AM

## 2020-05-05 NOTE — Patient Instructions (Signed)
Skip Rx on Sundays and Wednesdays

## 2020-07-03 ENCOUNTER — Other Ambulatory Visit: Payer: Medicare Other

## 2020-07-10 ENCOUNTER — Ambulatory Visit: Payer: Medicare Other | Admitting: Endocrinology

## 2020-07-14 ENCOUNTER — Other Ambulatory Visit (INDEPENDENT_AMBULATORY_CARE_PROVIDER_SITE_OTHER): Payer: Medicare Other

## 2020-07-14 ENCOUNTER — Other Ambulatory Visit: Payer: Self-pay

## 2020-07-14 DIAGNOSIS — E890 Postprocedural hypothyroidism: Secondary | ICD-10-CM

## 2020-07-14 LAB — T4, FREE: Free T4: 0.76 ng/dL (ref 0.60–1.60)

## 2020-07-14 LAB — TSH: TSH: 4.1 u[IU]/mL (ref 0.35–4.50)

## 2020-07-21 ENCOUNTER — Telehealth (INDEPENDENT_AMBULATORY_CARE_PROVIDER_SITE_OTHER): Payer: Medicare Other | Admitting: Endocrinology

## 2020-07-21 ENCOUNTER — Other Ambulatory Visit: Payer: Self-pay

## 2020-07-21 ENCOUNTER — Encounter: Payer: Self-pay | Admitting: Endocrinology

## 2020-07-21 VITALS — Ht 65.0 in | Wt 154.0 lb

## 2020-07-21 DIAGNOSIS — E890 Postprocedural hypothyroidism: Secondary | ICD-10-CM | POA: Diagnosis not present

## 2020-07-21 MED ORDER — LEVOTHYROXINE SODIUM 50 MCG PO TABS: 50.0000 ug | ORAL_TABLET | Freq: Every day | ORAL | 1 refills | Status: DC

## 2020-07-21 NOTE — Progress Notes (Signed)
Patient ID: Deborah Banks, female   DOB: 06/22/1956, 64 y.o.   MRN: [id number]  I connected with the above-named patient by video enabled telemedicine application and verified that I am speaking with the correct person. The patient was explained the limitations of evaluation and management by telemedicine and the availability of in person appointments.  Patient also understood that there may be a patient responsible charge related to this service . Location of the patient: Patient's home . Location of the provider: Physician office Only the patient and myself were participating in the encounter The patient understood the above statements and agreed to proceed.  Reason for Appointment:  followup of hypothyroidism  History of Present Illness:     Background information:1 She previously had hyperthyroidism,  first diagnosed in 09/2013 presenting with symptoms of palpitations, shortness of breath  Because of her need for persistent antithyroid drugs she was given 15 mCi I-131 treatment on 04/11/14    HISTORY: Her free T4 was low in 10/15 although at that time she was subjectively feeling fairly good except for cold intolerance She was started on 75 g of levothyroxine  However in 11/15 although she was not having any hyperthyroid symptoms she was having more problems with her eyes being more prominent and also dryness. Because of high normal free T4 and suppressed TSH her dose was reduced to 50 g  This was further reduced to 37.5 g in 9/16 and at that time she was feeling fairly good overall In 6/17 she had run out of her levothyroxine medication for 6 weeks, was taking 37.5 g Since her TSH was normal at 1.1 she was told to hold off on restarting  Recent history:  Hypothyroidism: Diagnosed in 08/2016 when TSH was going up without the levothyroxine supplement although she was not complaining of any unusual fatigue, she was complaining of cold  intolerance  She was started on 25 g of levothyroxine and this was further increased up to 50 g in 3/18 when TSH was high However her TSH was further increased at 13.1 in 5/18 Her levothyroxine was increased to 75 g but subsequently requirements have been variable   She was on of levothyroxine since 9/19 but since 12/20 she has been on 68.5 mcg daily  She has been asymptomatic previously with high TSH levels  On her last visit she was told to take 137 mcg, half tablet 5 days a week because of her TSH being unexpectedly low at 0.11 She has been taking her prescription quite consistently as directed Her pharmacy is the same  Recently has been having normal energy level, now hair loss, heat or cold intolerance or weight gain  She  is taking her levothyroxine consistently in the morning before breakfast   Not on calcium/iron supplements  Her TSH which was 0.11 done at [facility] is now 4.1   Weight history:  Wt Readings from Last 3 Encounters:  05/05/20 154 lb 9.6 oz (70.1 kg)  09/07/18 146 lb 6.4 oz (66.4 kg)  06/08/18 141 lb (64 kg)     Lab Results  Component Value Date   TSH 4.10 07/14/2020   TSH 0.11 (A) 04/16/2020   TSH 4.76 (H) 01/11/2020   FREET4 0.76 07/14/2020   FREET4 0.81 01/11/2020   FREET4 1.04 09/07/2019         Lab Results  Component Value Date   T3FREE 2.4 03/16/2016   T3FREE 3.3 06/23/2015  Allergies as of 07/21/2020   No Known Allergies     Medication List       Accurate as of July 21, 2020  3:14 PM. If you have any questions, ask your nurse or doctor.        levothyroxine 137 MCG tablet Commonly known as: SYNTHROID Take 0.5 tablets (68.5 mcg total) by mouth daily before breakfast.           Past Medical History:  Diagnosis Date  . Abnormal CT scan, chest    a. mild mediastinal lymphadenopathy - instructed to f/u PCP.  . Diastolic CHF ([facility])    a. During 09/2013 adm for CP - normal cors, no PE. CT angio with LVH.  Marland Kitchen  Elevated alkaline phosphatase level    a. During 09/2013 adm.  Marland Kitchen GERD (gastroesophageal reflux disease)   . Hypokalemia   . Microscopic hematuria    a. During 09/2013 adm.  Marland Kitchen PAF (paroxysmal atrial fibrillation) ([facility])    a. During 09/2013 adm for CP - normal cors, no PE.  Marland Kitchen Paroxysmal atrial flutter ([facility])    a. During 09/2013 adm for CP - normal cors, no PE.    Past Surgical History:  Procedure Laterality Date  . ABDOMINAL HYSTERECTOMY    . LEFT HEART CATHETERIZATION WITH CORONARY ANGIOGRAM N/A 09/03/2013   Procedure: LEFT HEART CATHETERIZATION WITH CORONARY ANGIOGRAM;  Surgeon: Corky Crafts, MD;  Location: [facility] CATH LAB;  Service: Cardiovascular;  Laterality: N/A;    Family History  Problem Relation Age of Onset  . Heart attack Mother   . CAD Mother   . Diabetes Mother   . Thyroid disease Other   . Hypertension Neg Hx     Social History:  reports that she has never smoked. She has never used smokeless tobacco. She reports that she does not drink alcohol and does not use drugs.  Allergies: No Known Allergies  Review of Systems:   She is not on any prescription drugs except levothyroxine  No history of hypertension   Examination:   There were no vitals taken for this visit.     Assessment/Plan:   Post-ablative hypothyroidism   She has had mild  post ablative hypothyroidism after her I-131 treatment in 04/2014.  She has had variable requirement for levothyroxine Currently taking 137 mcg, half tablet 5 times a week which is giving her the equivalent of about 49 mcg daily  Her TSH has been fluctuating even with a small replacement dose  She has been quite regular with her thyroid supplements Subjectively doing well and her weight gain appears to be leveled off  Since her TSH is around 4 and she is subjectively doing well will keep her on the same approximate dose of levothyroxine generic For simplicity she can switch to 50 mcg every day   There are no  Patient Instructions on file for this visit.    [name] 07/21/2020, 3:14 PM

## 2020-10-21 ENCOUNTER — Other Ambulatory Visit: Payer: Self-pay | Admitting: Endocrinology

## 2020-11-18 ENCOUNTER — Other Ambulatory Visit (INDEPENDENT_AMBULATORY_CARE_PROVIDER_SITE_OTHER): Payer: Medicare Other

## 2020-11-18 ENCOUNTER — Other Ambulatory Visit: Payer: Self-pay

## 2020-11-18 DIAGNOSIS — E890 Postprocedural hypothyroidism: Secondary | ICD-10-CM | POA: Diagnosis not present

## 2020-11-18 LAB — T4, FREE: Free T4: 0.79 ng/dL (ref 0.60–1.60)

## 2020-11-18 LAB — TSH: TSH: 8.93 u[IU]/mL — ABNORMAL HIGH (ref 0.35–4.50)

## 2020-11-20 ENCOUNTER — Other Ambulatory Visit: Payer: Self-pay

## 2020-11-20 ENCOUNTER — Telehealth (INDEPENDENT_AMBULATORY_CARE_PROVIDER_SITE_OTHER): Payer: Medicare Other | Admitting: Endocrinology

## 2020-11-20 ENCOUNTER — Encounter: Payer: Self-pay | Admitting: Endocrinology

## 2020-11-20 VITALS — Ht 65.0 in | Wt 165.0 lb

## 2020-11-20 DIAGNOSIS — E890 Postprocedural hypothyroidism: Secondary | ICD-10-CM | POA: Diagnosis not present

## 2020-11-20 MED ORDER — LEVOTHYROXINE SODIUM 137 MCG PO TABS: 68.5000 ug | ORAL_TABLET | Freq: Every day | ORAL | 1 refills | Status: DC

## 2020-11-20 NOTE — Progress Notes (Signed)
Patient ID: Deborah Banks, female   DOB: 1956/10/03, 65 y.o.   MRN: [id number]  I connected with the above-named patient by video enabled telemedicine application and verified that I am speaking with the correct person. The patient was explained the limitations of evaluation and management by telemedicine and the availability of in person appointments.  Patient also understood that there may be a patient responsible charge related to this service . Location of the patient: Patient's home . Location of the provider: Physician office Only the patient and myself were participating in the encounter The patient understood the above statements and agreed to proceed.  Reason for Appointment:  followup of hypothyroidism  History of Present Illness:     Background information:1 She previously had hyperthyroidism,  first diagnosed in 09/2013 presenting with symptoms of palpitations, shortness of breath  Because of her need for persistent antithyroid drugs she was given 15 mCi I-131 treatment on 04/11/14    HISTORY: Her free T4 was low in 10/15 although at that time she was subjectively feeling fairly good except for cold intolerance She was started on 75 g of levothyroxine  However in 11/15 although she was not having any hyperthyroid symptoms she was having more problems with her eyes being more prominent and also dryness. Because of high normal free T4 and suppressed TSH her dose was reduced to 50 g  This was further reduced to 37.5 g in 9/16 and at that time she was feeling fairly good overall In 6/17 she had run out of her levothyroxine medication for 6 weeks, was taking 37.5 g Since her TSH was normal at 1.1 she was told to hold off on restarting  Recent history:  Hypothyroidism: Diagnosed in 08/2016 when TSH was going up without the levothyroxine supplement although she was not complaining of any unusual fatigue, she was complaining of cold  intolerance  She was started on 25 g of levothyroxine and this was further increased up to 50 g in 3/18 when TSH was high However her TSH was further increased at 13.1 in 5/18  Her levothyroxine was increased to 75 g but subsequently dosages have been variable   She has been asymptomatic with high TSH levels  On her last visit she was taking the equivalent of about 50 mcg levothyroxine daily using the 137 mcg tablets but for simplicity was changed to 50 mcg daily She has gained weight Otherwise she feels fairly good with no fatigue or lethargy 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 which may not be any worse  She  is taking her levothyroxine consistently in the morning before eating with water  Not on calcium/iron supplements  Her TSH which was about 4 is now up to about 9   Weight history:  Wt Readings from Last 3 Encounters:  11/20/20 165 lb (74.8 kg)  07/21/20 154 lb (69.9 kg)  05/05/20 154 lb 9.6 oz (70.1 kg)     Lab Results  Component Value Date   TSH 8.93 (H) 11/18/2020   TSH 4.10 07/14/2020   TSH 0.11 (A) 04/16/2020   FREET4 0.79 11/18/2020   FREET4 0.76 07/14/2020   FREET4 0.81 01/11/2020         Lab Results  Component Value Date   T3FREE 2.4 03/16/2016   T3FREE 3.3 06/23/2015      Allergies as of 11/20/2020   No Known Allergies     Medication List  Accurate as of November 20, 2020 10:45 AM. If you have any questions, ask your nurse or doctor.        levothyroxine 50 MCG tablet Commonly known as: SYNTHROID TAKE 1 TABLET(50 MCG) BY MOUTH DAILY           Past Medical History:  Diagnosis Date  . Abnormal CT scan, chest    a. mild mediastinal lymphadenopathy - instructed to f/u PCP.  . Diastolic CHF ([facility])    a. During 09/2013 adm for CP - normal cors, no PE. CT angio with LVH.  Marland Kitchen Elevated alkaline phosphatase level    a. During 09/2013 adm.  Marland Kitchen GERD (gastroesophageal reflux disease)   . Hypokalemia   . Microscopic hematuria     a. During 09/2013 adm.  Marland Kitchen PAF (paroxysmal atrial fibrillation) ([facility])    a. During 09/2013 adm for CP - normal cors, no PE.  Marland Kitchen Paroxysmal atrial flutter ([facility])    a. During 09/2013 adm for CP - normal cors, no PE.    Past Surgical History:  Procedure Laterality Date  . ABDOMINAL HYSTERECTOMY    . LEFT HEART CATHETERIZATION WITH CORONARY ANGIOGRAM N/A 09/03/2013   Procedure: LEFT HEART CATHETERIZATION WITH CORONARY ANGIOGRAM;  Surgeon: Corky Crafts, MD;  Location: [facility] CATH LAB;  Service: Cardiovascular;  Laterality: N/A;    Family History  Problem Relation Age of Onset  . Heart attack Mother   . CAD Mother   . Diabetes Mother   . Thyroid disease Other   . Hypertension Neg Hx     Social History:  reports that she has never smoked. She has never used smokeless tobacco. She reports that she does not drink alcohol and does not use drugs.  Allergies: No Known Allergies  Review of Systems:   She is not on any prescription drugs except levothyroxine    Examination:   Ht 5\' 5"  (1.651 m)   Wt 165 lb (74.8 kg)   BMI 27.46 kg/m      Assessment/Plan:   Post-ablative hypothyroidism   She has had mild  post ablative hypothyroidism after her I-131 treatment in 04/2014.  She has had variable requirement for thyroid supplementation Although TSH was normal with taking about 50 mcg levothyroxine on last visit it is now trending higher; conversely last summer it was low  Her TSH has been fluctuating even with no requirements for supplementation  She has been quite regular with her levothyroxine before breakfast Has gained weight and this may be causing some increased requirement  Since she just filled a 90-day prescription for the 50 mcg she can continue this but take 75 mcg on weekdays and 50 mcg on weekends This will give her an average dose of about 68 mcg Next prescription will be for half of the 137 mcg tablets   There are no Patient Instructions on file for this  visit.    05/2014 11/20/2020, 10:45 AM

## 2020-11-25 ENCOUNTER — Other Ambulatory Visit: Payer: Medicare Other

## 2020-11-27 ENCOUNTER — Ambulatory Visit: Payer: Medicare Other | Admitting: Endocrinology

## 2020-12-02 ENCOUNTER — Telehealth: Payer: Self-pay | Admitting: Endocrinology

## 2020-12-02 NOTE — Telephone Encounter (Signed)
-----   Message from [name], MD sent at 11/20/2020 11:52 AM EST ----- Regarding: Follow-up She will be needing follow-up for 12 weeks with labs, virtual visit okay

## 2020-12-02 NOTE — Telephone Encounter (Signed)
Will call back to schedule appt.

## 2021-02-02 ENCOUNTER — Other Ambulatory Visit (INDEPENDENT_AMBULATORY_CARE_PROVIDER_SITE_OTHER): Payer: Medicare Other

## 2021-02-02 ENCOUNTER — Other Ambulatory Visit: Payer: Self-pay

## 2021-02-02 DIAGNOSIS — E890 Postprocedural hypothyroidism: Secondary | ICD-10-CM

## 2021-02-02 LAB — TSH: TSH: 1.16 u[IU]/mL (ref 0.35–4.50)

## 2021-02-02 LAB — T4, FREE: Free T4: 0.97 ng/dL (ref 0.60–1.60)

## 2021-02-05 ENCOUNTER — Telehealth (INDEPENDENT_AMBULATORY_CARE_PROVIDER_SITE_OTHER): Payer: Medicare Other | Admitting: Endocrinology

## 2021-02-05 ENCOUNTER — Encounter: Payer: Self-pay | Admitting: Endocrinology

## 2021-02-05 ENCOUNTER — Other Ambulatory Visit: Payer: Self-pay

## 2021-02-05 DIAGNOSIS — E890 Postprocedural hypothyroidism: Secondary | ICD-10-CM

## 2021-02-05 NOTE — Progress Notes (Signed)
Patient ID: Deborah Banks, female   DOB: [birth year], 65 y.o.   MRN: [id number]  I connected with the above-named patient by video enabled telemedicine application and verified that I am speaking with the correct person. The patient was explained the limitations of evaluation and management by telemedicine and the availability of in person appointments.  Patient also understood that there may be a patient responsible charge related to this service . Location of the patient: Patient's home . Location of the provider: Physician office Only the patient and myself were participating in the encounter The patient understood the above statements and agreed to proceed.  Reason for Appointment:  followup of hypothyroidism  History of Present Illness:     Background information:1 She previously had hyperthyroidism,  first diagnosed in 09/2013 presenting with symptoms of palpitations, shortness of breath  Because of her need for persistent antithyroid drugs she was given 15 mCi I-131 treatment on 04/11/14    HISTORY: Her free T4 was low in 10/15 although at that time she was subjectively feeling fairly good except for cold intolerance She was started on 75 g of levothyroxine  However in 11/15 although she was not having any hyperthyroid symptoms she was having more problems with her eyes being more prominent and also dryness. Because of high normal free T4 and suppressed TSH her dose was reduced to 50 g  This was further reduced to 37.5 g in 9/16 and at that time she was feeling fairly good overall In 6/17 she had run out of her levothyroxine medication for 6 weeks, was taking 37.5 g Since her TSH was normal at 1.1 she was told to hold off on restarting  Recent history:  Hypothyroidism: Diagnosed in 08/2016 when TSH was going up without the levothyroxine supplement although she was not complaining of any unusual fatigue, she was complaining of cold  intolerance  She was started on 25 g of levothyroxine and this was further increased up to 50 g in 3/18 when TSH was high However her TSH was further increased at 13.1 in 5/18  Her levothyroxine was increased to 75 g but subsequently dosages have been variable   She has been asymptomatic with high TSH levels  On her last visit she was taking the equivalent of about 68 mcg levothyroxine daily Although she had not complained of feeling fatigued in 2/22 with switching to the 137 mcg tablets, half daily she says she feels less tired and overall better  Also her weight gain has leveled off apparently 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 as before  She  is taking her levothyroxine consistently in the morning before eating daily  Not on calcium/iron supplements  Her TSH which was about 8.9 is now back down to 1.2   Weight history:  Wt Readings from Last 3 Encounters:  11/20/20 165 lb (74.8 kg)  07/21/20 154 lb (69.9 kg)  05/05/20 154 lb 9.6 oz (70.1 kg)     Lab Results  Component Value Date   TSH 1.16 02/02/2021   TSH 8.93 (H) 11/18/2020   TSH 4.10 07/14/2020   FREET4 0.97 02/02/2021   FREET4 0.79 11/18/2020   FREET4 0.76 07/14/2020         Lab Results  Component Value Date   T3FREE 2.4 03/16/2016   T3FREE 3.3 06/23/2015      Allergies as of 02/05/2021   No Known Allergies     Medication List  Accurate as of Feb 05, 2021 10:04 AM. If you have any questions, ask your nurse or doctor.        levothyroxine 137 MCG tablet Commonly known as: Synthroid Take 0.5 tablets (68.5 mcg total) by mouth daily before breakfast.           Past Medical History:  Diagnosis Date  . Abnormal CT scan, chest    a. mild mediastinal lymphadenopathy - instructed to f/u PCP.  . Diastolic CHF (HCC)    a. During 09/2013 adm for CP - normal cors, no PE. CT angio with LVH.  Marland Kitchen Elevated alkaline phosphatase level    a. During 09/2013 adm.  Marland Kitchen GERD (gastroesophageal reflux  disease)   . Hypokalemia   . Microscopic hematuria    a. During 09/2013 adm.  Marland Kitchen PAF (paroxysmal atrial fibrillation) (HCC)    a. During 09/2013 adm for CP - normal cors, no PE.  Marland Kitchen Paroxysmal atrial flutter (HCC)    a. During 09/2013 adm for CP - normal cors, no PE.    Past Surgical History:  Procedure Laterality Date  . ABDOMINAL HYSTERECTOMY    . LEFT HEART CATHETERIZATION WITH CORONARY ANGIOGRAM N/A 09/03/2013   Procedure: LEFT HEART CATHETERIZATION WITH CORONARY ANGIOGRAM;  Surgeon: Corky Crafts, MD;  Location: [facility] CATH LAB;  Service: Cardiovascular;  Laterality: N/A;    Family History  Problem Relation Age of Onset  . Heart attack Mother   . CAD Mother   . Diabetes Mother   . Thyroid disease Other   . Hypertension Neg Hx     Social History:  reports that she has never smoked. She has never used smokeless tobacco. She reports that she does not drink alcohol and does not use drugs.  Allergies: No Known Allergies  Review of Systems:   She is not on any prescription drugs except levothyroxine    Examination:   There were no vitals taken for this visit.     Assessment/Plan:   Post-ablative hypothyroidism   She has had mild  post ablative hypothyroidism after her I-131 treatment in 04/2014.  She has had variable requirement for thyroid supplementation For her weight of about 75 kg she is still requiring relatively low doses of levothyroxine, now taking half of the 137 mcg daily  TSH is now finally back to normal With adjusting her dose on the last visit she is subjectively doing better  She will not continue the half of the 137 mcg tablets every morning and reminded her not taking this without any iron or calcium supplements before breakfast   There are no Patient Instructions on file for this visit.    [name] 02/05/2021, 10:04 AM

## 2021-06-01 ENCOUNTER — Other Ambulatory Visit: Payer: Self-pay | Admitting: Endocrinology

## 2021-07-13 ENCOUNTER — Other Ambulatory Visit: Payer: Self-pay

## 2021-07-13 ENCOUNTER — Other Ambulatory Visit (INDEPENDENT_AMBULATORY_CARE_PROVIDER_SITE_OTHER): Payer: Medicare Other

## 2021-07-13 DIAGNOSIS — E890 Postprocedural hypothyroidism: Secondary | ICD-10-CM | POA: Diagnosis not present

## 2021-07-13 LAB — T4, FREE: Free T4: 0.96 ng/dL (ref 0.60–1.60)

## 2021-07-13 LAB — TSH: TSH: 0.54 u[IU]/mL (ref 0.35–5.50)

## 2021-07-16 ENCOUNTER — Telehealth (INDEPENDENT_AMBULATORY_CARE_PROVIDER_SITE_OTHER): Payer: Medicare Other | Admitting: Endocrinology

## 2021-07-16 ENCOUNTER — Encounter: Payer: Self-pay | Admitting: Endocrinology

## 2021-07-16 ENCOUNTER — Other Ambulatory Visit: Payer: Self-pay

## 2021-07-16 VITALS — Ht 64.0 in | Wt 148.0 lb

## 2021-07-16 DIAGNOSIS — E890 Postprocedural hypothyroidism: Secondary | ICD-10-CM | POA: Diagnosis not present

## 2021-07-16 NOTE — Progress Notes (Signed)
Patient ID: Deborah Banks, female   DOB: 16-Aug-1956, 65 y.o.   MRN: [id number]  I connected with the above-named patient by video enabled telemedicine application and verified that I am speaking with the correct person. The patient was explained the limitations of evaluation and management by telemedicine and the availability of in person appointments.  Patient also understood that there may be a patient responsible charge related to this service  Location of the patient: Patient's home  Location of the provider: Physician office Only the patient and myself were participating in the encounter The patient understood the above statements and agreed to proceed.  Reason for Appointment:  followup of hypothyroidism  History of Present Illness:     Background information:1 She previously had hyperthyroidism,  first diagnosed in 09/2013 presenting with symptoms of palpitations, shortness of breath  Because of her need for persistent antithyroid drugs she was given 15 mCi I-131 treatment on 04/11/14    HISTORY: Her free T4 was low in 10/15 although at that time she was subjectively feeling fairly good except for cold intolerance She was started on 75 g of levothyroxine  However in 11/15 although she was not having any hyperthyroid symptoms she was having more problems with her eyes being more prominent and also dryness. Because of high normal free T4 and suppressed TSH her dose was reduced to 50 g  This was further reduced to 37.5 g in 9/16 and at that time she was feeling fairly good overall In 6/17 she had run out of her levothyroxine medication for 6 weeks, was taking 37.5 g Since her TSH was normal at 1.1 she was told to hold off on restarting  Recent history:  Hypothyroidism: Diagnosed in 08/2016 when TSH was going up without the levothyroxine supplement although she was not complaining of any unusual fatigue, she was complaining of cold intolerance  She  was started on 25 g of levothyroxine and this was further increased up to 50 g in 3/18 when TSH was high Since then dosages have been variable   She has been asymptomatic with high TSH levels in the past  When she was switched to the 137 mcg tablets, half daily she says she feels less tired compared to the previous visit when her TSH was high   She has lost weight because she thinks she has not been finding the time to eat Otherwise does not think she has any fatigue, also no shakiness or palpitations or nervousness 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 as before  She  is taking her levothyroxine consistently in the morning before eating daily  Not on calcium/iron supplements  Her TSH is now below 1.0 compared to 1.2 previously    Weight history:  Wt Readings from Last 3 Encounters:  07/16/21 148 lb (67.1 kg)  11/20/20 165 lb (74.8 kg)  07/21/20 154 lb (69.9 kg)     Lab Results  Component Value Date   TSH 0.54 07/13/2021   TSH 1.16 02/02/2021   TSH 8.93 (H) 11/18/2020   FREET4 0.96 07/13/2021   FREET4 0.97 02/02/2021   FREET4 0.79 11/18/2020         Lab Results  Component Value Date   T3FREE 2.4 03/16/2016   T3FREE 3.3 06/23/2015      Allergies as of 07/16/2021   No Known Allergies      Medication List        Accurate as  of July 16, 2021  9:51 AM. If you have any questions, ask your nurse or doctor.          levothyroxine 137 MCG tablet Commonly known as: SYNTHROID TAKE 1/2 TABLET(68.5 MCG) BY MOUTH DAILY BEFORE BREAKFAST            Past Medical History:  Diagnosis Date   Abnormal CT scan, chest    a. mild mediastinal lymphadenopathy - instructed to f/u PCP.   Diastolic CHF (HCC)    a. During 09/2013 adm for CP - normal cors, no PE. CT angio with LVH.   Elevated alkaline phosphatase level    a. During 09/2013 adm.   GERD (gastroesophageal reflux disease)    Hypokalemia    Microscopic hematuria    a. During 09/2013 adm.   PAF  (paroxysmal atrial fibrillation) (HCC)    a. During 09/2013 adm for CP - normal cors, no PE.   Paroxysmal atrial flutter (HCC)    a. During 09/2013 adm for CP - normal cors, no PE.    Past Surgical History:  Procedure Laterality Date   ABDOMINAL HYSTERECTOMY     LEFT HEART CATHETERIZATION WITH CORONARY ANGIOGRAM N/A 09/03/2013   Procedure: LEFT HEART CATHETERIZATION WITH CORONARY ANGIOGRAM;  Surgeon: Corky Crafts, MD;  Location: [facility] CATH LAB;  Service: Cardiovascular;  Laterality: N/A;    Family History  Problem Relation Age of Onset   Heart attack Mother    CAD Mother    Diabetes Mother    Thyroid disease Other    Hypertension Neg Hx     Social History:  reports that she has never smoked. She has never used smokeless tobacco. She reports that she does not drink alcohol and does not use drugs.  Allergies: No Known Allergies  Review of Systems:   She is not on any prescription drugs except levothyroxine Not on any iron supplements    Examination:   Ht 5\' 4"  (1.626 m)   Wt 148 lb (67.1 kg)   BMI 25.40 kg/m      Assessment/Plan:   Post-ablative hypothyroidism   She has had post ablative hypothyroidism after her I-131 treatment in 04/2014.  She has had variable requirement for thyroid supplementation but usually relatively low doses On her last visit her medication was continued unchanged, now taking half of the 137 mcg daily  TSH is now low normal although she is asymptomatic This may be related to her significant weight loss since her last visit  She will continue the half of the 137 mcg dosage but take this only 6 days a week and follow-up in 6 months   Reminded her not to take any iron or calcium supplements before breakfast   There are no Patient Instructions on file for this visit.    05/2014 07/16/2021, 9:51 AM

## 2021-12-04 ENCOUNTER — Other Ambulatory Visit: Payer: Self-pay | Admitting: Endocrinology

## 2022-06-02 ENCOUNTER — Other Ambulatory Visit: Payer: Self-pay | Admitting: Endocrinology

## 2022-06-13 ENCOUNTER — Other Ambulatory Visit: Payer: Self-pay | Admitting: Endocrinology

## 2022-06-14 ENCOUNTER — Telehealth: Payer: Self-pay | Admitting: Endocrinology

## 2022-06-14 DIAGNOSIS — E890 Postprocedural hypothyroidism: Secondary | ICD-10-CM

## 2022-06-14 MED ORDER — LEVOTHYROXINE SODIUM 137 MCG PO TABS: ORAL_TABLET | ORAL | 1 refills | Status: DC

## 2022-06-14 NOTE — Telephone Encounter (Signed)
RX now sent to preferred pharmacy.  

## 2022-06-14 NOTE — Telephone Encounter (Signed)
MEDICATION: Levothyroxine  PHARMACY: [facility], [place]   HAS THE PATIENT CONTACTED THEIR PHARMACY?    IS THIS A 90 DAY SUPPLY : unknown  IS PATIENT OUT OF MEDICATION: yes  IF NOT; HOW MUCH IS LEFT:   LAST APPOINTMENT DATE: @5 /02/2021  NEXT APPOINTMENT DATE:@1 /12/2022  DO WE HAVE YOUR PERMISSION TO LEAVE A DETAILED MESSAGE?:  OTHER COMMENTS:  Patient has scheduled upcoming appt  **Let patient know to contact pharmacy at the end of the day to make sure medication is ready. **  ** Please notify patient to allow 48-72 hours to process**  **Encourage patient to contact the pharmacy for refills or they can request refills through [facility]**

## 2022-09-30 ENCOUNTER — Other Ambulatory Visit (INDEPENDENT_AMBULATORY_CARE_PROVIDER_SITE_OTHER): Payer: Medicare Other

## 2022-09-30 ENCOUNTER — Other Ambulatory Visit: Payer: Self-pay | Admitting: Endocrinology

## 2022-09-30 DIAGNOSIS — E890 Postprocedural hypothyroidism: Secondary | ICD-10-CM

## 2022-10-01 LAB — TSH: TSH: 19.98 u[IU]/mL — ABNORMAL HIGH (ref 0.35–5.50)

## 2022-10-01 LAB — T4, FREE: Free T4: 0.71 ng/dL (ref 0.60–1.60)

## 2022-10-06 ENCOUNTER — Ambulatory Visit: Payer: Medicare Other | Admitting: Endocrinology

## 2022-10-06 ENCOUNTER — Encounter: Payer: Self-pay | Admitting: Endocrinology

## 2022-10-06 VITALS — BP 142/82 | HR 64 | Ht 64.0 in | Wt 153.8 lb

## 2022-10-06 DIAGNOSIS — E890 Postprocedural hypothyroidism: Secondary | ICD-10-CM | POA: Diagnosis not present

## 2022-10-06 MED ORDER — LEVOTHYROXINE SODIUM 75 MCG PO TABS: 75.0000 ug | ORAL_TABLET | Freq: Every day | ORAL | 3 refills | Status: DC

## 2022-10-06 NOTE — Patient Instructions (Signed)
Start new Rx 

## 2022-10-06 NOTE — Progress Notes (Signed)
Patient ID: Deborah Banks, female   DOB: 11/10/1955, 67 y.o.   MRN: [id number]   Reason for Appointment:  followup of hypothyroidism  History of Present Illness:     Background information:1 She previously had hyperthyroidism,  first diagnosed in 09/2013 presenting with symptoms of palpitations, shortness of breath  Because of her need for persistent antithyroid drugs she was given 15 mCi I-131 treatment on 04/11/14    HISTORY: Her free T4 was low in 10/15 although at that time she was subjectively feeling fairly good except for cold intolerance She was started on 75 g of levothyroxine  However in 11/15 although she was not having any hyperthyroid symptoms she was having more problems with her eyes being more prominent and also dryness. Because of high normal free T4 and suppressed TSH her dose was reduced to 50 g  This was further reduced to 37.5 g in 9/16 and at that time she was feeling fairly good overall In 6/17 she had run out of her levothyroxine medication for 6 weeks, was taking 37.5 g Since her TSH was normal at 1.1 she was told to hold off on restarting  Recent history:  Hypothyroidism: Diagnosed in 08/2016 when TSH was going up without the levothyroxine supplement although she was not complaining of any unusual fatigue, she was complaining of cold intolerance  She was started on 25 g of levothyroxine and this was further increased up to 50 g in 3/18 when TSH was high Since then dosages have been variable   She has been asymptomatic with high TSH levels in the past although generally feels better when her dose is increased She has not been seen in follow-up since 10/22 At that time she was told to take her dose of 137 mcg, half tablet 6 days a week  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 as before She does tend to feel more fatigue lately Also has gained some weight  She  is taking her levothyroxine consistently in the morning  before eating daily  She has not taken any calcium/iron supplements  Her TSH is now below 1.0 compared to 1.2 previously  Weight history:  Wt Readings from Last 3 Encounters:  10/06/22 153 lb 12.8 oz (69.8 kg)  07/16/21 148 lb (67.1 kg)  11/20/20 165 lb (74.8 kg)     Lab Results  Component Value Date   TSH 19.98 (H) 09/30/2022   TSH 0.54 07/13/2021   TSH 1.16 02/02/2021   FREET4 0.71 09/30/2022   FREET4 0.96 07/13/2021   FREET4 0.97 02/02/2021         Lab Results  Component Value Date   T3FREE 2.4 03/16/2016   T3FREE 3.3 06/23/2015      Allergies as of 10/06/2022   No Known Allergies      Medication List        Accurate as of October 06, 2022  9:39 AM. If you have any questions, ask your nurse or doctor.          levothyroxine 137 MCG tablet Commonly known as: SYNTHROID TAKE 1/2 TABLET(68.5 MCG) BY MOUTH DAILY BEFORE BREAKFAST            Past Medical History:  Diagnosis Date   Abnormal CT scan, chest    a. mild mediastinal lymphadenopathy - instructed to f/u PCP.   Diastolic CHF ([place])    a. During 09/2013 adm for CP - normal cors, no PE.  CT angio with LVH.   Elevated alkaline phosphatase level    a. During 09/2013 adm.   GERD (gastroesophageal reflux disease)    Hypokalemia    Microscopic hematuria    a. During 09/2013 adm.   PAF (paroxysmal atrial fibrillation) ([place])    a. During 09/2013 adm for CP - normal cors, no PE.   Paroxysmal atrial flutter ([place])    a. During 09/2013 adm for CP - normal cors, no PE.    Past Surgical History:  Procedure Laterality Date   ABDOMINAL HYSTERECTOMY     LEFT HEART CATHETERIZATION WITH CORONARY ANGIOGRAM N/A 09/03/2013   Procedure: LEFT HEART CATHETERIZATION WITH CORONARY ANGIOGRAM;  Surgeon: Jettie Booze, MD;  Location: [facility] CATH LAB;  Service: Cardiovascular;  Laterality: N/A;    Family History  Problem Relation Age of Onset   Heart attack Mother    CAD Mother    Diabetes Mother    Thyroid  disease Other    Hypertension Neg Hx     Social History:  reports that she has never smoked. She has never used smokeless tobacco. She reports that she does not drink alcohol and does not use drugs.  Allergies: No Known Allergies  Review of Systems:   She is not on any prescription drugs except levothyroxine  No difficulties with bowel habits    Examination:   BP (!) 142/82   Pulse 64   Ht 5\' 4"  (1.626 m)   Wt 153 lb 12.8 oz (69.8 kg)   SpO2 99%   BMI 26.40 kg/m   Thyroid not palpable Reflexes show normal relaxation No dryness of the skin   Assessment/Plan:   Post-ablative hypothyroidism   She has had post ablative hypothyroidism after her I-131 treatment in 04/2014.  She has had variable requirement for thyroid supplementation but previously taking relatively low doses  She does complain of fatigue and her TSH is unusually high at 19 Previously was taking the equivalent of 58 mcg of levothyroxine daily  She will now start taking 75 mcg daily Discussed that she needs to follow-up regularly as scheduled   There are no Patient Instructions on file for this visit.    Elayne Snare 10/06/2022, 9:39 AM

## 2022-12-01 ENCOUNTER — Other Ambulatory Visit (INDEPENDENT_AMBULATORY_CARE_PROVIDER_SITE_OTHER): Payer: Medicare Other

## 2022-12-01 DIAGNOSIS — E890 Postprocedural hypothyroidism: Secondary | ICD-10-CM | POA: Diagnosis not present

## 2022-12-01 LAB — TSH: TSH: 0.69 u[IU]/mL (ref 0.35–5.50)

## 2022-12-02 NOTE — Progress Notes (Signed)
Please call to let patient know that the thyroid results are normal and continue to take 75 mcg levothyroxine

## 2023-03-03 ENCOUNTER — Other Ambulatory Visit: Payer: Self-pay | Admitting: Endocrinology

## 2023-03-03 DIAGNOSIS — E890 Postprocedural hypothyroidism: Secondary | ICD-10-CM

## 2023-03-04 ENCOUNTER — Other Ambulatory Visit (INDEPENDENT_AMBULATORY_CARE_PROVIDER_SITE_OTHER): Payer: Medicare Other

## 2023-03-04 DIAGNOSIS — E890 Postprocedural hypothyroidism: Secondary | ICD-10-CM | POA: Diagnosis not present

## 2023-03-04 LAB — T4, FREE: Free T4: 1.01 ng/dL (ref 0.60–1.60)

## 2023-03-04 LAB — TSH: TSH: 0.41 u[IU]/mL (ref 0.35–5.50)

## 2023-03-08 ENCOUNTER — Ambulatory Visit: Payer: Medicare Other | Admitting: Endocrinology

## 2023-03-24 ENCOUNTER — Encounter: Payer: Self-pay | Admitting: Endocrinology

## 2023-03-24 ENCOUNTER — Telehealth (INDEPENDENT_AMBULATORY_CARE_PROVIDER_SITE_OTHER): Payer: Medicare Other | Admitting: Endocrinology

## 2023-03-24 VITALS — Ht 64.0 in

## 2023-03-24 DIAGNOSIS — E890 Postprocedural hypothyroidism: Secondary | ICD-10-CM

## 2023-03-24 NOTE — Progress Notes (Signed)
Patient ID: Deborah Banks, female   DOB: [birth year], 67 y.o.   MRN: [id number]   Reason for Appointment:  followup of hypothyroidism  I connected with the above-named patient by video enabled telemedicine application and verified that I am speaking with the correct person. The patient was explained the limitations of evaluation and management by telemedicine and the availability of in person appointments.  Patient also understood that there may be a patient responsible charge related to this service  Location of the patient: Patient's home  Location of the provider: Physician office Only the patient and myself were participating in the encounter The patient understood the above statements and agreed to proceed.   History of Present Illness:     Background information:1 She previously had hyperthyroidism,  first diagnosed in 09/2013 presenting with symptoms of palpitations, shortness of breath  Because of her need for persistent antithyroid drugs she was given 15 mCi I-131 treatment on 04/11/14    HISTORY: Her free T4 was low in 10/15 although at that time she was subjectively feeling fairly good except for cold intolerance She was started on 75 g of levothyroxine  However in 11/15 although she was not having any hyperthyroid symptoms she was having more problems with her eyes being more prominent and also dryness. Because of high normal free T4 and suppressed TSH her dose was reduced to 50 g  This was further reduced to 37.5 g in 9/16 and at that time she was feeling fairly good overall In 6/17 she had run out of her levothyroxine medication for 6 weeks, was taking 37.5 g Since her TSH was normal at 1.1 she was told to hold off on restarting  Recent history:  Hypothyroidism: Diagnosed in 08/2016 when TSH was going up without the levothyroxine supplement although she was not complaining of any unusual fatigue, she was complaining of cold  intolerance  She was started on 25 g of levothyroxine and this was further increased up to 50 g in 3/18 when TSH was high Since then her levothyroxine dose has varied  She has been asymptomatic with high TSH levels in the past although generally feels better when her dose is increased  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 but overall feels fairly good with her energy level Also no jitteriness or palpitations On her last visit her dose was increased to 75 mcg levothyroxine when her TSH was about 20 without any difficulty and compliance with her medication  She  is taking her levothyroxine consistently in the morning before breakfast daily  She has not taken any calcium/iron supplements  Her TSH is now below 1.0  Weight history:  Wt Readings from Last 3 Encounters:  10/06/22 153 lb 12.8 oz (69.8 kg)  07/16/21 148 lb (67.1 kg)  11/20/20 165 lb (74.8 kg)     Lab Results  Component Value Date   TSH 0.41 03/04/2023   TSH 0.69 12/01/2022   TSH 19.98 (H) 09/30/2022   FREET4 1.01 03/04/2023   FREET4 0.71 09/30/2022   FREET4 0.96 07/13/2021         Lab Results  Component Value Date   T3FREE 2.4 03/16/2016   T3FREE 3.3 06/23/2015      Allergies as of 03/24/2023   No Known Allergies      Medication List        Accurate as of March 24, 2023  4:22 PM. If you have any questions,  ask your nurse or doctor.          levothyroxine 75 MCG tablet Commonly known as: SYNTHROID Take 1 tablet (75 mcg total) by mouth daily.            Past Medical History:  Diagnosis Date   Abnormal CT scan, chest    a. mild mediastinal lymphadenopathy - instructed to f/u PCP.   Diastolic CHF (HCC)    a. During 09/2013 adm for CP - normal cors, no PE. CT angio with LVH.   Elevated alkaline phosphatase level    a. During 09/2013 adm.   GERD (gastroesophageal reflux disease)    Hypokalemia    Microscopic hematuria    a. During 09/2013 adm.   PAF (paroxysmal atrial  fibrillation) (HCC)    a. During 09/2013 adm for CP - normal cors, no PE.   Paroxysmal atrial flutter (HCC)    a. During 09/2013 adm for CP - normal cors, no PE.    Past Surgical History:  Procedure Laterality Date   ABDOMINAL HYSTERECTOMY     LEFT HEART CATHETERIZATION WITH CORONARY ANGIOGRAM N/A 09/03/2013   Procedure: LEFT HEART CATHETERIZATION WITH CORONARY ANGIOGRAM;  Surgeon: Corky Crafts, MD;  Location: [facility] CATH LAB;  Service: Cardiovascular;  Laterality: N/A;    Family History  Problem Relation Age of Onset   Heart attack Mother    CAD Mother    Diabetes Mother    Thyroid disease Other    Hypertension Neg Hx     Social History:  reports that she has never smoked. She has never used smokeless tobacco. She reports that she does not drink alcohol and does not use drugs.  Allergies: No Known Allergies  Review of Systems:   She is not on any prescription drugs except levothyroxine  No difficulties with bowel habits    Examination:   Ht 5\' 4"  (1.626 m)   BMI 26.40 kg/m     Assessment/Plan:   Post-ablative hypothyroidism   She has had post ablative hypothyroidism after her I-131 treatment in 04/2014.  She has had variable requirement for thyroid supplementation but previously taking relatively low doses  She does not complain of any fatigue lately  She has had improvement in her fatigue, her dose was increased from about 58 to the new dose of 75 mcg daily TSH which was unusually high at 19 is now 0.4    She will now continue taking 75 mcg daily except half tablet on Sunday Follow-up in 4 months   There are no Patient Instructions on file for this visit.    [name] 03/24/2023, 4:22 PM

## 2023-05-19 ENCOUNTER — Telehealth: Payer: Self-pay | Admitting: Endocrinology

## 2023-05-19 MED ORDER — LEVOTHYROXINE SODIUM 75 MCG PO TABS: 75.0000 ug | ORAL_TABLET | Freq: Every day | ORAL | 3 refills | Status: DC

## 2023-05-19 NOTE — Telephone Encounter (Signed)
Done

## 2023-05-19 NOTE — Telephone Encounter (Signed)
Patient advising she need a refill for her Thyroid RX. Please advise. [facility] in [place]

## 2023-07-19 ENCOUNTER — Ambulatory Visit: Payer: Medicare Other | Admitting: "Endocrinology

## 2023-07-19 ENCOUNTER — Encounter: Payer: Self-pay | Admitting: "Endocrinology

## 2023-07-19 VITALS — BP 120/83 | HR 59 | Ht 64.0 in | Wt 155.2 lb

## 2023-07-19 DIAGNOSIS — E890 Postprocedural hypothyroidism: Secondary | ICD-10-CM | POA: Diagnosis not present

## 2023-07-19 NOTE — Progress Notes (Signed)
Outpatient Endocrinology Note Deborah Craig, MD  07/19/23   Deborah Banks 05-12-1956 [id number]  Referring Provider: Jonetta Osgood, MD Primary Care Provider: Jonetta Osgood, MD Subjective  No chief complaint on file.   Assessment & Plan  Diagnoses and all orders for this visit:  Hypothyroidism, postradioiodine therapy    Deborah Banks is currently taking levothyroxine 75 mcg qd. Patient is currently clinically euthyroid.  Educated on thyroid axis.  Recommend the following: Take levothyroxine 75 mcg every morning. Labs today. Advised to take levothyroxine first thing in the morning on empty stomach and wait at least 30 minutes to 1 hour before eating or drinking anything or taking any other medications. Space out levothyroxine by 4 hours from any acid reflux medication/fibrate/iron/calcium/multivitamin. Advised to take birth control pills and nutritional supplements in the evening. Repeat lab before next visit or sooner if symptoms of hyperthyroidism or hypothyroidism develop.  Notify us immediately in case of pregnancy/breastfeeding or significant weight gain or loss. Counseled on compliance and follow up needs.  I have reviewed current medications, nurse's notes, allergies, vital signs, past medical and surgical history, family medical history, and social history for this encounter. Counseled patient on symptoms, examination findings, lab findings, imaging results, treatment decisions and monitoring and prognosis. The patient understood the recommendations and agrees with the treatment plan. All questions regarding treatment plan were fully answered.   Return in about 4 months (around 11/19/2023) for tele-visit: 3:20 pm, labs today, labs before next visit.   Deborah Glencoe, MD  07/19/23   I have reviewed current medications, nurse's notes, allergies, vital signs, past medical and surgical history, family medical history, and social history for this  encounter. Counseled patient on symptoms, examination findings, lab findings, imaging results, treatment decisions and monitoring and prognosis. The patient understood the recommendations and agrees with the treatment plan. All questions regarding treatment plan were fully answered.   History of Present Illness Deborah Banks is a 67 y.o. year old female who presents to our clinic with hypothyroidism.  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 Diagnosed in 08/2016 when TSH was going up without the levothyroxine supplement although she was not complaining of any unusual fatigue, she was complaining of cold intolerance  She was started on 25 g of levothyroxine and this was further increased up to 50 g in 3/18 when TSH was high. Since then her levothyroxine dose has varied  Symptoms suggestive of HYPOTHYROIDISM:  fatigue Yes weight gain No cold intolerance  No constipation  No  Symptoms suggestive of HYPERTHYROIDISM:  weight loss  No heat intolerance No hyperdefecation  No palpitations  No  Compressive symptoms:  dysphagia  No dysphonia  No positional dyspnea (especially with simultaneous arms elevation)  No  Smokes  No On biotin  No Personal history of head/neck surgery/irradiation  No  Physical Exam  BP 120/83   Pulse (!) 59   Ht 5\' 4"  (1.626 m)   Wt 155 lb 3.2 oz (70.4 kg)   SpO2 96%   BMI 26.64 kg/m  Constitutional: well developed, well nourished Head: normocephalic, atraumatic, no exophthalmos Eyes: sclera anicteric, no redness Neck: no thyromegaly, no thyroid tenderness; no nodules palpated Lungs: normal respiratory effort Neurology: alert and oriented, no fine hand tremor Skin: dry, no appreciable rashes Musculoskeletal: no appreciable defects Psychiatric: normal mood and affect  Allergies No Known Allergies  Current Medications Patient's Medications  New Prescriptions   No medications on file  Previous Medications    LEVOTHYROXINE (SYNTHROID) 75 MCG TABLET    Take 1 tablet (75 mcg total) by mouth daily.  Modified Medications   No medications on file  Discontinued Medications   No medications on file    Past Medical History Past Medical History:  Diagnosis Date   Abnormal CT scan, chest    a. mild mediastinal lymphadenopathy - instructed to f/u PCP.   Diastolic CHF (HCC)    a. During 09/2013 adm for CP - normal cors, no PE. CT angio with LVH.   Elevated alkaline phosphatase level    a. During 09/2013 adm.   GERD (gastroesophageal reflux disease)    Hypokalemia    Microscopic hematuria    a. During 09/2013 adm.   PAF (paroxysmal atrial fibrillation) (HCC)    a. During 09/2013 adm for CP - normal cors, no PE.   Paroxysmal atrial flutter (HCC)    a. During 09/2013 adm for CP - normal cors, no PE.    Past Surgical History Past Surgical History:  Procedure Laterality Date   ABDOMINAL HYSTERECTOMY     LEFT HEART CATHETERIZATION WITH CORONARY ANGIOGRAM N/A 09/03/2013   Procedure: LEFT HEART CATHETERIZATION WITH CORONARY ANGIOGRAM;  Surgeon: Corky Crafts, MD;  Location: [facility] CATH LAB;  Service: Cardiovascular;  Laterality: N/A;    Family History family history includes CAD in her mother; Diabetes in her mother; Heart attack in her mother; Thyroid disease in an other family member.  Social History Social History   Socioeconomic History   Marital status: Single    Spouse name: Not on file   Number of children: Not on file   Years of education: Not on file   Highest education level: Not on file  Occupational History   Not on file  Tobacco Use   Smoking status: Never   Smokeless tobacco: Never  Substance and Sexual Activity   Alcohol use: No   Drug use: No   Sexual activity: Not on file  Other Topics Concern   Not on file  Social History Narrative   Not on file   Social Determinants of Health   Financial Resource Strain: Not on File (01/21/2022)   Received from [facility], [employer] [facility]    Financial Resource Strain    Financial Resource Strain: 0  Food Insecurity: Not on File (06/30/2023)   Received from [facility] Insecurity    Food: 0  Transportation Needs: Not on File (01/21/2022)   Received from [place], [employer] Needs    Transportation: 0  Physical Activity: Not on File (01/21/2022)   Received from [place], [place]   Physical Activity    Physical Activity: 0  Stress: Not on File (01/21/2022)   Received from [facility], [place]   Stress    Stress: 0  Social Connections: Not on File (06/18/2023)   Received from [facility]   Social Connections    Connectedness: 0  Intimate Partner Violence: Not on file    Laboratory Investigations Lab Results  Component Value Date   TSH 0.41 03/04/2023   TSH 0.69 12/01/2022   TSH 19.98 (H) 09/30/2022   FREET4 1.01 03/04/2023   FREET4 0.71 09/30/2022   FREET4 0.96 07/13/2021     No results found for: "TSI"   No components found for: "TRAB"   Lab Results  Component Value Date   CHOL 187 04/16/2020   Lab Results  Component Value Date   HDL 51 04/16/2020   Lab Results  Component Value Date  LDLCALC 121 04/16/2020   Lab Results  Component Value Date   TRIG 83 04/16/2020   No results found for: "CHOLHDL" Lab Results  Component Value Date   CREATININE 0.6 04/16/2020   Lab Results  Component Value Date   GFR 182.55 05/01/2014      Component Value Date/Time   NA 140 05/01/2014 1323   K 3.7 05/13/2014 1454   CL 106 05/01/2014 1323   CO2 28 05/01/2014 1323   GLUCOSE 78 05/01/2014 1323   BUN 11 04/16/2020 0000   CREATININE 0.6 04/16/2020 0000   CREATININE 0.5 05/01/2014 1323   CALCIUM 9.0 05/01/2014 1323   PROT 6.8 10/15/2013 1647   ALBUMIN 3.9 04/16/2020 0000   AST 18 10/15/2013 1647   ALT 16 10/15/2013 1647   ALKPHOS 158 (H) 10/15/2013 1647   BILITOT 0.5 10/15/2013 1647   GFRNONAA >90 09/04/2013 0520   GFRAA >90 09/04/2013 0520      Latest Ref Rng & Units 04/16/2020   12:00 AM 05/13/2014    2:54  PM 05/01/2014    1:23 PM  BMP  Glucose 70 - 99 mg/dL   78   BUN 4 - 21 11   9    Creatinine 0.5 - 1.1 0.6   0.5   Sodium 135 - 145 mEq/L   140   Potassium 3.5 - 5.1 mEq/L  3.7  2.8   Chloride 96 - 112 mEq/L   106   CO2 19 - 32 mEq/L   28   Calcium 8.4 - 10.5 mg/dL   9.0        Component Value Date/Time   WBC 5.9 09/03/2013 1300   RBC 4.57 09/03/2013 1300   HGB 12.6 09/03/2013 1300   HCT 37.5 09/03/2013 1300   PLT 246 09/03/2013 1300   MCV 82.1 09/03/2013 1300   MCH 27.6 09/03/2013 1300   MCHC 33.6 09/03/2013 1300   RDW 13.2 09/03/2013 1300   LYMPHSABS 1.4 09/03/2013 1300   MONOABS 0.7 09/03/2013 1300   EOSABS 0.0 09/03/2013 1300   BASOSABS 0.0 09/03/2013 1300      Parts of this note may have been dictated using voice recognition software. There may be variances in spelling and vocabulary which are unintentional. Not all errors are proofread. Please notify the Thereasa Parkin if any discrepancies are noted or if the meaning of any statement is not clear.

## 2023-11-10 ENCOUNTER — Other Ambulatory Visit: Payer: Self-pay

## 2023-11-10 DIAGNOSIS — E890 Postprocedural hypothyroidism: Secondary | ICD-10-CM

## 2023-11-15 ENCOUNTER — Other Ambulatory Visit: Payer: Medicare Other

## 2023-11-17 ENCOUNTER — Other Ambulatory Visit: Payer: Medicare Other

## 2023-11-21 ENCOUNTER — Telehealth: Payer: Medicare Other | Admitting: "Endocrinology

## 2023-12-12 ENCOUNTER — Other Ambulatory Visit

## 2023-12-13 LAB — TSH: TSH: 1.37 m[IU]/L (ref 0.40–4.50)

## 2023-12-13 LAB — T4, FREE: Free T4: 1.3 ng/dL (ref 0.8–1.8)

## 2023-12-19 ENCOUNTER — Telehealth (INDEPENDENT_AMBULATORY_CARE_PROVIDER_SITE_OTHER): Admitting: "Endocrinology

## 2023-12-19 ENCOUNTER — Encounter: Payer: Self-pay | Admitting: "Endocrinology

## 2023-12-19 DIAGNOSIS — E890 Postprocedural hypothyroidism: Secondary | ICD-10-CM

## 2023-12-19 MED ORDER — LEVOTHYROXINE SODIUM 75 MCG PO TABS: 75.0000 ug | ORAL_TABLET | Freq: Every day | ORAL | 1 refills | Status: DC

## 2023-12-19 NOTE — Progress Notes (Signed)
 The patient reports they are currently: [place]. I spent 4 minutes on the video with the patient on the date of service. I spent an additional 2-3 minutes on pre- and post-visit activities on the date of service.   The patient was physically located in [place] or a state in which I am permitted to provide care. The patient and/or parent/guardian understood that s/he may incur co-pays and cost sharing, and agreed to the telemedicine visit. The visit was reasonable and appropriate under the circumstances given the patient's presentation at the time.  The patient and/or parent/guardian has been advised of the potential risks and limitations of this mode of treatment (including, but not limited to, the absence of in-person examination) and has agreed to be treated using telemedicine. The patient's/patient's family's questions regarding telemedicine have been answered.   The patient and/or parent/guardian has also been advised to contact their provider's office for worsening conditions, and seek emergency medical treatment and/or call 911 if the patient deems either necessary.     Outpatient Endocrinology Note Altamese [place]lawn, MD  12/19/23   Deborah Banks 23-Jun-1956 [id number]  Referring Provider: Jonetta Osgood, MD Primary Care Provider: Jonetta Osgood, MD Subjective  No chief complaint on file.   Assessment & Plan  Diagnoses and all orders for this visit:  Hypothyroidism, postradioiodine therapy -     TSH(Reflex)  Other orders -     levothyroxine (SYNTHROID) 75 MCG tablet; Take 1 tablet (75 mcg total) by mouth daily.    Deborah Banks is currently taking levothyroxine 75 mcg qd. Patient is currently clinically euthyroid.  Educated on thyroid axis.  Recommend the following: Take levothyroxine 75 mcg every morning. Advised to take levothyroxine first thing in the morning on empty stomach and wait at least 30 minutes to 1 hour before eating or drinking anything  or taking any other medications. Space out levothyroxine by 4 hours from any acid reflux medication/fibrate/iron/calcium/multivitamin. Advised to take birth control pills and nutritional supplements in the evening. Repeat lab before next visit or sooner if symptoms of hyperthyroidism or hypothyroidism develop.  Notify us immediately in case of pregnancy/breastfeeding or significant weight gain or loss. Counseled on compliance and follow up needs.  I have reviewed current medications, nurse's notes, allergies, vital signs, past medical and surgical history, family medical history, and social history for this encounter. Counseled patient on symptoms, examination findings, lab findings, imaging results, treatment decisions and monitoring and prognosis. The patient understood the recommendations and agrees with the treatment plan. All questions regarding treatment plan were fully answered.   Return in about 6 months (around 06/20/2024) for visit + labs before next visit.   Altamese [place], MD  12/19/23   I have reviewed current medications, nurse's notes, allergies, vital signs, past medical and surgical history, family medical history, and social history for this encounter. Counseled patient on symptoms, examination findings, lab findings, imaging results, treatment decisions and monitoring and prognosis. The patient understood the recommendations and agrees with the treatment plan. All questions regarding treatment plan were fully answered.   History of Present Illness Deborah Banks is a 68 y.o. year old female who presents to our clinic with hypothyroidism.  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 Diagnosed in 08/2016 when TSH was going up without the levothyroxine supplement although she was not complaining of any unusual fatigue, she was complaining of cold intolerance  She was started on 25 g of levothyroxine and this was further  increased up to 50 g  in 3/18 when TSH was high. Since then her levothyroxine dose has varied.  Symptoms suggestive of HYPOTHYROIDISM:  fatigue Yes weight gain Yes cold intolerance  No constipation  No  Symptoms suggestive of HYPERTHYROIDISM:  weight loss  No heat intolerance No hyperdefecation  No palpitations  No  Compressive symptoms:  dysphagia  No dysphonia  No positional dyspnea (especially with simultaneous arms elevation)  No  Smokes  No On biotin  No Personal history of head/neck surgery/irradiation  No  Physical Exam  There were no vitals taken for this visit. Constitutional: well developed, well nourished Head: normocephalic, atraumatic, no exophthalmos Eyes: sclera anicteric, no redness Neck: no thyromegaly, no thyroid tenderness; no nodules palpated Lungs: normal respiratory effort Neurology: alert and oriented, no fine hand tremor Skin: dry, no appreciable rashes Musculoskeletal: no appreciable defects Psychiatric: normal mood and affect  Allergies No Known Allergies  Current Medications Patient's Medications  New Prescriptions   No medications on file  Previous Medications   No medications on file  Modified Medications   Modified Medication Previous Medication   LEVOTHYROXINE (SYNTHROID) 75 MCG TABLET levothyroxine (SYNTHROID) 75 MCG tablet      Take 1 tablet (75 mcg total) by mouth daily.    Take 1 tablet (75 mcg total) by mouth daily.  Discontinued Medications   No medications on file    Past Medical History Past Medical History:  Diagnosis Date   Abnormal CT scan, chest    a. mild mediastinal lymphadenopathy - instructed to f/u PCP.   Diastolic CHF (HCC)    a. During 09/2013 adm for CP - normal cors, no PE. CT angio with LVH.   Elevated alkaline phosphatase level    a. During 09/2013 adm.   GERD (gastroesophageal reflux disease)    Hypokalemia    Microscopic hematuria    a. During 09/2013 adm.   PAF (paroxysmal atrial fibrillation) (HCC)    a. During  09/2013 adm for CP - normal cors, no PE.   Paroxysmal atrial flutter (HCC)    a. During 09/2013 adm for CP - normal cors, no PE.    Past Surgical History Past Surgical History:  Procedure Laterality Date   ABDOMINAL HYSTERECTOMY     LEFT HEART CATHETERIZATION WITH CORONARY ANGIOGRAM N/A 09/03/2013   Procedure: LEFT HEART CATHETERIZATION WITH CORONARY ANGIOGRAM;  Surgeon: Corky Crafts, MD;  Location: [facility] CATH LAB;  Service: Cardiovascular;  Laterality: N/A;    Family History family history includes CAD in her mother; Diabetes in her mother; Heart attack in her mother; Thyroid disease in an other family member.  Social History Social History   Socioeconomic History   Marital status: Single    Spouse name: Not on file   Number of children: Not on file   Years of education: Not on file   Highest education level: Not on file  Occupational History   Not on file  Tobacco Use   Smoking status: Never   Smokeless tobacco: Never  Substance and Sexual Activity   Alcohol use: No   Drug use: No   Sexual activity: Not on file  Other Topics Concern   Not on file  Social History Narrative   Not on file   Social Drivers of Health   Financial Resource Strain: Not at Risk (08/09/2023)   Received from [facility]    Financial Resource Strain: 1  Food Insecurity: Not at Risk (08/09/2023)   Received from [facility]  Food Insecurity    Food: 1  Transportation Needs: Not at Risk (08/09/2023)   Received from [employer] Needs    Transportation: 1  Physical Activity: At Risk (08/09/2023)   Received from [facility]   Physical Activity    Physical Activity: 2  Stress: Not at Risk (08/09/2023)   Received from [facility]   Stress    Stress: 1  Social Connections: Not at Risk (08/09/2023)   Received from [employer]   Social Connections    Connectedness: 1  Intimate Partner Violence: Not on file    Laboratory Investigations Lab Results  Component Value Date   TSH  1.37 12/12/2023   TSH 0.41 03/04/2023   TSH 0.69 12/01/2022   FREET4 1.3 12/12/2023   FREET4 1.01 03/04/2023   FREET4 0.71 09/30/2022     No results found for: "TSI"   No components found for: "TRAB"   Lab Results  Component Value Date   CHOL 187 04/16/2020   Lab Results  Component Value Date   HDL 51 04/16/2020   Lab Results  Component Value Date   LDLCALC 121 04/16/2020   Lab Results  Component Value Date   TRIG 83 04/16/2020   No results found for: "CHOLHDL" Lab Results  Component Value Date   CREATININE 0.6 04/16/2020   Lab Results  Component Value Date   GFR 182.55 05/01/2014      Component Value Date/Time   NA 140 05/01/2014 1323   K 3.7 05/13/2014 1454   CL 106 05/01/2014 1323   CO2 28 05/01/2014 1323   GLUCOSE 78 05/01/2014 1323   BUN 11 04/16/2020 0000   CREATININE 0.6 04/16/2020 0000   CREATININE 0.5 05/01/2014 1323   CALCIUM 9.0 05/01/2014 1323   PROT 6.8 10/15/2013 1647   ALBUMIN 3.9 04/16/2020 0000   AST 18 10/15/2013 1647   ALT 16 10/15/2013 1647   ALKPHOS 158 (H) 10/15/2013 1647   BILITOT 0.5 10/15/2013 1647   GFRNONAA >90 09/04/2013 0520   GFRAA >90 09/04/2013 0520      Latest Ref Rng & Units 04/16/2020   12:00 AM 05/13/2014    2:54 PM 05/01/2014    1:23 PM  BMP  Glucose 70 - 99 mg/dL   78   BUN 4 - 21 11   9    Creatinine 0.5 - 1.1 0.6   0.5   Sodium 135 - 145 mEq/L   140   Potassium 3.5 - 5.1 mEq/L  3.7  2.8   Chloride 96 - 112 mEq/L   106   CO2 19 - 32 mEq/L   28   Calcium 8.4 - 10.5 mg/dL   9.0        Component Value Date/Time   WBC 5.9 09/03/2013 1300   RBC 4.57 09/03/2013 1300   HGB 12.6 09/03/2013 1300   HCT 37.5 09/03/2013 1300   PLT 246 09/03/2013 1300   MCV 82.1 09/03/2013 1300   MCH 27.6 09/03/2013 1300   MCHC 33.6 09/03/2013 1300   RDW 13.2 09/03/2013 1300   LYMPHSABS 1.4 09/03/2013 1300   MONOABS 0.7 09/03/2013 1300   EOSABS 0.0 09/03/2013 1300   BASOSABS 0.0 09/03/2013 1300      Parts of this note may  have been dictated using voice recognition software. There may be variances in spelling and vocabulary which are unintentional. Not all errors are proofread. Please notify the Thereasa Parkin if any discrepancies are noted or if the meaning of any statement is not clear.

## 2024-06-08 ENCOUNTER — Other Ambulatory Visit

## 2024-06-11 LAB — REFLEX TIQ

## 2024-06-11 LAB — TSH(REFL): TSH: 2.37 m[IU]/L (ref 0.40–4.50)

## 2024-06-15 ENCOUNTER — Encounter: Payer: Self-pay | Admitting: "Endocrinology

## 2024-06-15 ENCOUNTER — Telehealth (INDEPENDENT_AMBULATORY_CARE_PROVIDER_SITE_OTHER): Admitting: "Endocrinology

## 2024-06-15 VITALS — Ht 64.0 in | Wt 155.0 lb

## 2024-06-15 DIAGNOSIS — H0520 Unspecified exophthalmos: Secondary | ICD-10-CM

## 2024-06-15 DIAGNOSIS — E890 Postprocedural hypothyroidism: Secondary | ICD-10-CM

## 2024-06-15 MED ORDER — LEVOTHYROXINE SODIUM 75 MCG PO TABS: 75.0000 ug | ORAL_TABLET | Freq: Every day | ORAL | 1 refills | Status: AC

## 2024-06-15 NOTE — Progress Notes (Signed)
 The patient reports they are currently: [place]. I spent 4 minutes on the video with the patient on the date of service. I spent an additional 2-3 minutes on pre- and post-visit activities on the date of service.   The patient was physically located in [place]  or a state in which I am permitted to provide care. The patient and/or parent/guardian understood that s/he may incur co-pays and cost sharing, and agreed to the telemedicine visit. The visit was reasonable and appropriate under the circumstances given the patient's presentation at the time.  The patient and/or parent/guardian has been advised of the potential risks and limitations of this mode of treatment (including, but not limited to, the absence of in-person examination) and has agreed to be treated using telemedicine. The patient's/patient's family's questions regarding telemedicine have been answered.   The patient and/or parent/guardian has also been advised to contact their provider's office for worsening conditions, and seek emergency medical treatment and/or call 911 if the patient deems either necessary.     Outpatient Endocrinology Note Deborah Birmingham, MD  06/15/24   Deborah Banks March 04, 1956 [id number]  Referring Provider: Montgomery Sherlean DELENA, MD Primary Care Provider: Montgomery Sherlean DELENA, MD Subjective  No chief complaint on file.   Assessment & Plan  Diagnoses and all orders for this visit:  Hypothyroidism, postradioiodine therapy -     TSH + free T4  Bulging eyes  Other orders -     levothyroxine  (SYNTHROID ) 75 MCG tablet; Take 1 tablet (75 mcg total) by mouth daily.    [name] is currently taking levothyroxine  75 mcg qd. 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  Educated on thyroid  axis.  Recommend the following: Take levothyroxine  75 mcg every morning. Advised to take levothyroxine  first thing in the  morning on empty stomach and wait at least 30 minutes to 1 hour before eating or drinking anything or taking any other medications. Space out levothyroxine  by 4 hours from any acid reflux medication/fibrate/iron/calcium/multivitamin. Advised to take birth control pills and nutritional supplements in the evening. Repeat lab before next visit or sooner if symptoms of hyperthyroidism or hypothyroidism develop.  Notify us  immediately in case of pregnancy/breastfeeding or significant weight gain or loss. Counseled on compliance and follow up needs.  Noticed proptosis, L eye >right, patient has not noticed it Has upcoming ophthalmology eye exam  I have reviewed current medications, nurse's notes, allergies, vital signs, past medical and surgical history, family medical history, and social history for this encounter. Counseled patient on symptoms, examination findings, lab findings, imaging results, treatment decisions and monitoring and prognosis. The patient understood the recommendations and agrees with the treatment plan. All questions regarding treatment plan were fully answered.   Return in about 6 months (around 12/13/2024) for visit + labs before next visit.   Deborah Birmingham, MD  06/15/24   I have reviewed current medications, nurse's notes, allergies, vital signs, past medical and surgical history, family medical history, and social history for this encounter. Counseled patient on symptoms, examination findings, lab findings, imaging results, treatment decisions and monitoring and prognosis. The patient understood the recommendations and agrees with the treatment plan. All questions regarding treatment plan were fully answered.   History of Present Illness Deborah Banks is a 68 y.o. year old female who presents to our clinic with hypothyroidism.  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 Diagnosed in 08/2016 when TSH was  going up without the  levothyroxine  supplement although she was not complaining of any unusual fatigue, she was complaining of cold intolerance  She was started on 25 g of levothyroxine  and this was further increased up to 50 g in 3/18 when TSH was high. Since then her levothyroxine  dose has varied.  Symptoms suggestive of HYPOTHYROIDISM:  fatigue No weight gain Yes cold intolerance  No constipation  No  Symptoms suggestive of HYPERTHYROIDISM:  weight loss  No heat intolerance No hyperdefecation  No palpitations  No  Compressive symptoms:  dysphagia  No dysphonia  No positional dyspnea (especially with simultaneous arms elevation)  No  Smokes  No On biotin  No Personal history of head/neck surgery/irradiation  No  Physical Exam  Ht 5' 4 (1.626 m)   Wt 155 lb (70.3 kg)   BMI 26.61 kg/m  Constitutional: well developed, well nourished, + Head: normocephalic, atraumatic, no exophthalmos Eyes: sclera anicteric, no redness Neck: + right thyromegaly?, no thyroid  tenderness; no nodules palpated Lungs: normal respiratory effort Neurology: alert and oriented, no fine hand tremor Skin: dry, no appreciable rashes Musculoskeletal: no appreciable defects Psychiatric: normal mood and affect  Allergies No Known Allergies  Current Medications Patient's Medications  New Prescriptions   No medications on file  Previous Medications   No medications on file  Modified Medications   Modified Medication Previous Medication   LEVOTHYROXINE  (SYNTHROID ) 75 MCG TABLET levothyroxine  (SYNTHROID ) 75 MCG tablet      Take 1 tablet (75 mcg total) by mouth daily.    Take 1 tablet (75 mcg total) by mouth daily.  Discontinued Medications   No medications on file    Past Medical History Past Medical History:  Diagnosis Date   Abnormal CT scan, chest    a. mild mediastinal lymphadenopathy - instructed to f/u PCP.   Diastolic CHF (HCC)    a. During 09/2013 adm for CP - normal cors, no PE. CT angio with LVH.    Elevated alkaline phosphatase level    a. During 09/2013 adm.   GERD (gastroesophageal reflux disease)    Hypokalemia    Microscopic hematuria    a. During 09/2013 adm.   PAF (paroxysmal atrial fibrillation) (HCC)    a. During 09/2013 adm for CP - normal cors, no PE.   Paroxysmal atrial flutter (HCC)    a. During 09/2013 adm for CP - normal cors, no PE.    Past Surgical History Past Surgical History:  Procedure Laterality Date   ABDOMINAL HYSTERECTOMY     LEFT HEART CATHETERIZATION WITH CORONARY ANGIOGRAM N/A 09/03/2013   Procedure: LEFT HEART CATHETERIZATION WITH CORONARY ANGIOGRAM;  Surgeon: Candyce GORMAN Reek, MD;  Location: [facility] CATH LAB;  Service: Cardiovascular;  Laterality: N/A;    Family History family history includes CAD in her mother; Diabetes in her mother; Heart attack in her mother; Thyroid  disease in an other family member.  Social History Social History   Socioeconomic History   Marital status: Single    Spouse name: Not on file   Number of children: Not on file   Years of education: Not on file   Highest education level: Not on file  Occupational History   Not on file  Tobacco Use   Smoking status: Never   Smokeless tobacco: Never  Substance and Sexual Activity   Alcohol use: No   Drug use: No   Sexual activity: Not on file  Other Topics Concern   Not on file  Social History Narrative   Not  on file   Social Drivers of Health   Financial Resource Strain: Not at Risk (08/09/2023)   Received from [employer]    How hard is it for you to pay for the very basics like food, housing, heating, medical care, and medications?: 1  Food Insecurity: Not at Risk (08/09/2023)   Received from [facility] Insecurity    Within the past 12 months, you worried that your food would run out before you got money to buy more.: 1  Transportation Needs: Not at Risk (08/09/2023)   Received from [employer] Needs    In the past 12 months,  has lack of transportation kept you from medical appointments, meetings, work or from getting things needed for daily living?: 1  Physical Activity: At Risk (08/09/2023)   Received from [facility]   Physical Activity    On average, how many minutes do you engage in exercise at this level?: 2  Stress: Not at Risk (08/09/2023)   Received from [facility]   Stress    Do you feel these kinds of stress these days?: 1  Social Connections: Not at Risk (08/09/2023)   Received from [facility]   Social Connections    How often do you see or talk to people that you care about and feel close to? (For example: talking to friends on phone, visiting friends or family, going to church or club meetings): 1  Intimate Partner Violence: Not on file    Laboratory Investigations Lab Results  Component Value Date   TSH 1.37 12/12/2023   TSH 0.41 03/04/2023   TSH 0.69 12/01/2022   FREET4 1.3 12/12/2023   FREET4 1.01 03/04/2023   FREET4 0.71 09/30/2022     No results found for: TSI   No components found for: TRAB   Lab Results  Component Value Date   CHOL 187 04/16/2020   Lab Results  Component Value Date   HDL 51 04/16/2020   Lab Results  Component Value Date   LDLCALC 121 04/16/2020   Lab Results  Component Value Date   TRIG 83 04/16/2020   No results found for: [facility] Lab Results  Component Value Date   CREATININE 0.6 04/16/2020   Lab Results  Component Value Date   GFR 182.55 05/01/2014      Component Value Date/Time   NA 140 05/01/2014 1323   K 3.7 05/13/2014 1454   CL 106 05/01/2014 1323   CO2 28 05/01/2014 1323   GLUCOSE 78 05/01/2014 1323   BUN 11 04/16/2020 0000   CREATININE 0.6 04/16/2020 0000   CREATININE 0.5 05/01/2014 1323   CALCIUM 9.0 05/01/2014 1323   PROT 6.8 10/15/2013 1647   ALBUMIN 3.9 04/16/2020 0000   AST 18 10/15/2013 1647   ALT 16 10/15/2013 1647   ALKPHOS 158 (H) 10/15/2013 1647   BILITOT 0.5 10/15/2013 1647   GFRNONAA >90 09/04/2013 0520   GFRAA >90  09/04/2013 0520      Latest Ref Rng & Units 04/16/2020   12:00 AM 05/13/2014    2:54 PM 05/01/2014    1:23 PM  BMP  Glucose 70 - 99 mg/dL   78   BUN 4 - 21 11   9    Creatinine 0.5 - 1.1 0.6   0.5   Sodium 135 - 145 mEq/L   140   Potassium 3.5 - 5.1 mEq/L  3.7  2.8   Chloride 96 - 112 mEq/L   106   CO2  19 - 32 mEq/L   28   Calcium 8.4 - 10.5 mg/dL   9.0        Component Value Date/Time   WBC 5.9 09/03/2013 1300   RBC 4.57 09/03/2013 1300   HGB 12.6 09/03/2013 1300   HCT 37.5 09/03/2013 1300   PLT 246 09/03/2013 1300   MCV 82.1 09/03/2013 1300   MCH 27.6 09/03/2013 1300   MCHC 33.6 09/03/2013 1300   RDW 13.2 09/03/2013 1300   LYMPHSABS 1.4 09/03/2013 1300   MONOABS 0.7 09/03/2013 1300   EOSABS 0.0 09/03/2013 1300   BASOSABS 0.0 09/03/2013 1300      Parts of this note may have been dictated using voice recognition software. There may be variances in spelling and vocabulary which are unintentional. Not all errors are proofread. Please notify the dino if any discrepancies are noted or if the meaning of any statement is not clear.
